# Patient Record
Sex: Male | Born: 1970
Health system: Southern US, Community
[De-identification: ages and names within clinical notes are randomized; demographics above are authoritative.]

## PROBLEM LIST (undated history)

## (undated) DIAGNOSIS — N529 Male erectile dysfunction, unspecified: Secondary | ICD-10-CM

## (undated) DIAGNOSIS — F419 Anxiety disorder, unspecified: Secondary | ICD-10-CM

## (undated) DIAGNOSIS — J302 Other seasonal allergic rhinitis: Secondary | ICD-10-CM

## (undated) DIAGNOSIS — R1310 Dysphagia, unspecified: Secondary | ICD-10-CM

## (undated) DIAGNOSIS — T7840XA Allergy, unspecified, initial encounter: Secondary | ICD-10-CM

## (undated) DIAGNOSIS — E785 Hyperlipidemia, unspecified: Secondary | ICD-10-CM

## (undated) DIAGNOSIS — K219 Gastro-esophageal reflux disease without esophagitis: Secondary | ICD-10-CM

## (undated) HISTORY — DX: Dysphagia, unspecified: R13.10

## (undated) HISTORY — DX: Gastro-esophageal reflux disease without esophagitis: K21.9

## (undated) HISTORY — DX: Male erectile dysfunction, unspecified: N52.9

## (undated) HISTORY — DX: Allergy, unspecified, initial encounter: T78.40XA

## (undated) HISTORY — DX: Other seasonal allergic rhinitis: J30.2

## (undated) HISTORY — DX: Hyperlipidemia, unspecified: E78.5

---

## 2009-12-11 ENCOUNTER — Telehealth: Payer: Self-pay | Admitting: Family Medicine

## 2009-12-11 ENCOUNTER — Ambulatory Visit: Payer: Self-pay | Admitting: Family Medicine

## 2009-12-11 DIAGNOSIS — K219 Gastro-esophageal reflux disease without esophagitis: Secondary | ICD-10-CM | POA: Insufficient documentation

## 2009-12-11 DIAGNOSIS — N529 Male erectile dysfunction, unspecified: Secondary | ICD-10-CM | POA: Insufficient documentation

## 2009-12-11 DIAGNOSIS — F528 Other sexual dysfunction not due to a substance or known physiological condition: Secondary | ICD-10-CM | POA: Insufficient documentation

## 2009-12-11 DIAGNOSIS — J45909 Unspecified asthma, uncomplicated: Secondary | ICD-10-CM | POA: Insufficient documentation

## 2009-12-12 LAB — CONVERTED CEMR LAB
AST: 33 units/L (ref 0–37)
Albumin: 4.4 g/dL (ref 3.5–5.2)
BUN: 11 mg/dL (ref 6–23)
Basophils Absolute: 0 10*3/uL (ref 0.0–0.1)
Basophils Relative: 0.6 % (ref 0.0–3.0)
Calcium: 9.3 mg/dL (ref 8.4–10.5)
Direct LDL: 153.7 mg/dL
Eosinophils Absolute: 0.2 10*3/uL (ref 0.0–0.7)
GFR calc non Af Amer: 80.26 mL/min (ref >60)
Glucose, Bld: 89 mg/dL (ref 70–99)
HDL: 42.3 mg/dL (ref >39.00)
Hemoglobin: 15.6 g/dL (ref 13.0–17.0)
Lymphocytes Relative: 32.6 % (ref 12.0–46.0)
MCHC: 32.7 g/dL (ref 30.0–36.0)
MCV: 95 fL (ref 78.0–100.0)
Monocytes Absolute: 0.6 10*3/uL (ref 0.1–1.0)
Neutro Abs: 2.5 10*3/uL (ref 1.4–7.7)
Neutrophils Relative %: 51.2 % (ref 43.0–77.0)
RDW: 12.5 % (ref 11.5–14.6)
Triglycerides: 106 mg/dL (ref 0.0–149.0)
WBC: 4.9 10*3/uL (ref 4.5–10.5)

## 2010-03-13 ENCOUNTER — Ambulatory Visit: Payer: Self-pay | Admitting: Family Medicine

## 2010-03-13 DIAGNOSIS — E785 Hyperlipidemia, unspecified: Secondary | ICD-10-CM | POA: Insufficient documentation

## 2010-03-16 LAB — CONVERTED CEMR LAB
ALT: 32 units/L (ref 0–53)
AST: 24 units/L (ref 0–37)
LDL Cholesterol: 129 mg/dL — ABNORMAL HIGH (ref 0–99)
VLDL: 17.8 mg/dL (ref 0.0–40.0)

## 2010-10-28 ENCOUNTER — Ambulatory Visit: Payer: Self-pay | Admitting: Internal Medicine

## 2010-10-28 DIAGNOSIS — J209 Acute bronchitis, unspecified: Secondary | ICD-10-CM | POA: Insufficient documentation

## 2011-01-07 NOTE — Assessment & Plan Note (Signed)
Summary: CPX   Vital Signs:  Patient profile:   40 year old male Height:      73.5 inches Weight:      233.75 pounds BMI:     30.53 Temp:     97.9 degrees F oral Pulse rate:   76 / minute Pulse rhythm:   regular BP sitting:   116 / 86  (left arm) Cuff size:   large  Vitals Entered By: Lewanda Rife LPN (March 13, 1609 10:38 AM) CC: complete physical   History of Present Illness: here for health mt and to disc chronic med problems   wt is down 2 lb   bp good 116/86  GERd - now on daily protonix  is much better - the symptoms are totally gone -- did not know he could feel this good also no problems with asthma   ED- cialis is working great   ? last TD-- has been a long time   no prostate problems that he knows of  nocturia once if any  no trouble with flow or stream    cholesterol high last check with HDL in 40s and LDL in 150s-- this is new  asked to watch his diet at that time  did cut out a lot of things high in sat fats - breakfast meats and fried foods  cut out the red meat- used to eat a lot of it  is wanting to loose some wt  no coronary artery disease infam  stil non smoker for 6 mo -- feels good   was exercising for a while - lifting weights and working in yard     Allergies (verified): No Known Drug Allergies  Past History:  Family History: Last updated: 03/13/2010 mother father grandfather lung ca and copd  grandmother died of ovarian cancer  no CAD or DM  ? if high chol in fam   Social History: Last updated: 12/11/2009 married  works in [employer] company quit smoking in 2010 (smoked for 15 years one ppd )  step daugher / one baby  exercise is intermittent  occ alcohol on weekends   Past Medical History: GERD asthma  ED allergic rhinitis  Family History: mother father grandfather lung ca and copd  grandmother died of ovarian cancer  no CAD or DM  ? if high chol in fam   Review of Systems General:  Denies fatigue, fever,  loss of appetite, and malaise. Eyes:  Denies blurring and eye irritation. ENT:  Complains of nasal congestion and postnasal drainage; denies sinus pressure. CV:  Denies chest pain or discomfort, palpitations, and shortness of breath with exertion. Resp:  Denies cough and wheezing. GI:  Denies abdominal pain, bloody stools, change in bowel habits, and nausea. GU:  Complains of erectile dysfunction; denies dysuria, nocturia, and urinary frequency. MS:  Denies joint pain, joint redness, and joint swelling. Derm:  Denies itching, lesion(s), poor wound healing, and rash. Neuro:  Denies numbness and tingling. Psych:  Denies anxiety and depression. Endo:  Denies cold intolerance, excessive thirst, excessive urination, and heat intolerance. Heme:  Denies abnormal bruising and bleeding.  Physical Exam  General:  Well-developed,well-nourished,in no acute distress; alert,appropriate and cooperative throughout examination Head:  normocephalic, atraumatic, and no abnormalities observed.   Eyes:  vision grossly intact, pupils equal, pupils round, and pupils reactive to light.  no conjunctival pallor, injection or icterus  Ears:  R ear normal and L ear normal.   Nose:  nares are boggy Mouth:  pharynx pink and moist.   Neck:  supple with full rom and no masses or thyromegally, no JVD or carotid bruit  Chest Wall:  No deformities, masses, tenderness or gynecomastia noted. Lungs:  Normal respiratory effort, chest expands symmetrically. Lungs are clear to auscultation, no crackles or wheezes. (no wheeze even on forced exp) Heart:  Normal rate and regular rhythm. S1 and S2 normal without gallop, murmur, click, rub or other extra sounds.  Abdomen:  Bowel sounds positive,abdomen soft and non-tender without masses, organomegaly or hernias noted. no renal bruits  Msk:  No deformity or scoliosis noted of thoracic or lumbar spine.  no acute joint changes  Pulses:  R and L carotid,radial,femoral,dorsalis pedis and  posterior tibial pulses are full and equal bilaterally Extremities:  No clubbing, cyanosis, edema, or deformity noted with normal full range of motion of all joints.   Neurologic:  sensation intact to light touch, gait normal, and DTRs symmetrical and normal.   Skin:  Intact without suspicious lesions or rashes some b9 app brown nevi and lentigos on back Cervical Nodes:  No lymphadenopathy noted Inguinal Nodes:  No significant adenopathy Psych:  normal affect, talkative and pleasant    Impression & Recommendations:  Problem # 1:  HEALTH MAINTENANCE EXAM (ICD-V70.0) Assessment Comment Only reviewed health habits including diet, exercise and skin cancer prevention reviewed health maintenance list and family history commended on better health habits  rev labs in detail - working on cholesterol td update today  Problem # 2:  ERECTILE DYSFUNCTION (ICD-302.72) Assessment: Unchanged stable - cialis works well refil for [employer]  His updated medication list for this problem includes:    Cialis 20 Mg Tabs (Tadalafil) .Marland Kitchen... Take by mouth as directed 30 min before sexual activity  Problem # 3:  GERD (ICD-530.81) Assessment: Improved much imp with daily ppi and diet - will continue  px printed for [facility]  f/u 1 year unless symptoms worsen His updated medication list for this problem includes:    Protonix 40 Mg Tbec (Pantoprazole sodium) .Marland Kitchen... 1 by mouth once daily  Problem # 4:  HYPERLIPIDEMIA (ICD-272.4) Assessment: New this is new- rev labs today in detail  goal LDL is under 130- rev reasons why  much better diet now -- much less sat fat  rev low sat fat diet/ things to avoid disc risks of high chol - vascular dz and MI and stroke over time- and reason to control it  lab today and adv  Orders: Venipuncture (62952) TLB-Lipid Panel (80061-LIPID) TLB-ALT (SGPT) (84460-ALT) TLB-AST (SGOT) (84450-SGOT)  Complete Medication List: 1)  Cialis 20 Mg Tabs (Tadalafil) .... Take by  mouth as directed 30 min before sexual activity 2)  Ventolin Hfa 108 (90 Base) Mcg/act Aers (Albuterol sulfate) .... 2 puffs up to every 4 hours as needed asthma 3)  Protonix 40 Mg Tbec (Pantoprazole sodium) .Marland Kitchen.. 1 by mouth once daily 4)  Claritin 10 Mg Tabs (Loratadine) .Marland Kitchen.. 1 by mouth once daily as needed  Other Orders: TD Toxoids IM 7 YR + (84132) Admin 1st Vaccine (44010)  Patient Instructions: 1)  tetnus shot today 2)  labs for cholesterol today 3)  keep up the good work with diet and get back to exercise 4)  no change in medicines  Prescriptions: CIALIS 20 MG TABS (TADALAFIL) take by mouth as directed 30 min before sexual activity  #27 x 3   Entered and Authorized by:   Judith Part MD   Signed by:   Colon Flattery  Danylah Holden MD on 03/13/2010   Method used:   Print then Give to Patient   RxID:   [id number] PROTONIX 40 MG TBEC (PANTOPRAZOLE SODIUM) 1 by mouth once daily  #90 x 3   Entered and Authorized by:   Judith Part MD   Signed by:   Judith Part MD on 03/13/2010   Method used:   Print then Give to Patient   RxID:   [id number]   Current Allergies (reviewed today): No known allergies     Immunizations Administered:  Tetanus Vaccine:    Vaccine Type: Td    Site: right deltoid    Mfr: Sanofi Pasteur    Dose: 0.5 ml    Route: IM    Given by: Lewanda Rife LPN    Exp. Date: 10/21/2011    Lot #: [id number]    VIS given: 10/24/07 version given March 13, 2010.

## 2011-01-07 NOTE — Assessment & Plan Note (Signed)
Summary: NEW PT TO EST/CLE   Vital Signs:  Patient profile:   40 year old male Height:      73.5 inches Weight:      235 pounds BMI:     30.69 Temp:     97.7 degrees F oral Pulse rate:   68 / minute Pulse rhythm:   regular BP sitting:   124 / 100  (left arm) Cuff size:   large  Vitals Entered By: Lowella Petties CMA (December 11, 2009 11:15 AM)  Serial Vital Signs/Assessments:  Time      Position  BP       Pulse  Resp  Temp     By                     135/85                         Judith Part MD  CC: New patient here to establish care.   History of Present Illness: here to est as new pt   used to see Dr Guerry Bruin - at [facility]  lives close by here in [place]- and this is more convenient   is fairly healthy  hx of acid reflux and prevacid works pretty well (does not take every day)  has bad heartburn every single day  occas has to take ranitidine on top of that -- if symptoms break through  protonix helped in past occ gets a sharp pain- brief in R upper abd  occ gets epigastric pain that is sharp as well   ? if he has ever had chol checked   never had endoscopy   asthma is fairly well controlled  quit smoking 3 months ago  need refil of his inhaler  feels a lot better now   ED - for whole adult life  has seen urologists times 2-- no particular reason  is generally innermittent - cialis works well  no hx of HTN   used to exercise more - new baby this year  plans to start exercise back next week    Allergies (verified): No Known Drug Allergies  Past History:  Past Medical History: GERD asthma  ED  Family History: mother father grandfather lung ca and copd  grandmother died of ovarian cancer  no CAD or DM   Social History: married  works in [employer] company quit smoking in 2010 (smoked for 15 years one ppd )  step daugher / one baby  exercise is intermittent  occ alcohol on weekends   Review of Systems General:  Denies  fatigue, fever, loss of appetite, malaise, and weight loss. ENT:  Denies sinus pressure and sore throat. CV:  Denies chest pain or discomfort, lightheadness, palpitations, and shortness of breath with exertion. Resp:  Complains of wheezing; denies cough, pleuritic, and shortness of breath. GI:  Complains of gas and indigestion; denies abdominal pain, change in bowel habits, loss of appetite, nausea, and vomiting. GU:  Complains of erectile dysfunction; denies discharge, dysuria, urinary frequency, and urinary hesitancy. MS:  Denies joint pain. Derm:  Denies lesion(s), poor wound healing, and rash. Neuro:  Denies numbness and tingling. Psych:  Denies anxiety and depression. Endo:  Denies cold intolerance, excessive thirst, excessive urination, and heat intolerance.  Physical Exam  General:  Well-developed,well-nourished,in no acute distress; alert,appropriate and cooperative throughout examination Head:  normocephalic, atraumatic, and no abnormalities observed.   Eyes:  vision grossly  intact, pupils equal, pupils round, and pupils reactive to light.  no conjunctival pallor, injection or icterus  Ears:  R ear normal and L ear normal.   Nose:  no nasal discharge.   Mouth:  pharynx pink and moist.   Neck:  supple with full rom and no masses or thyromegally, no JVD or carotid bruit  Chest Wall:  No deformities, masses, tenderness or gynecomastia noted. Lungs:  Normal respiratory effort, chest expands symmetrically. Lungs are clear to auscultation, no crackles or wheezes. (no wheeze even on forced exp) Heart:  Normal rate and regular rhythm. S1 and S2 normal without gallop, murmur, click, rub or other extra sounds.  Abdomen:  Bowel sounds positive,abdomen soft and non-tender without masses, organomegaly or hernias noted. no renal bruits Msk:  No deformity or scoliosis noted of thoracic or lumbar spine.  no acute joint changes  Pulses:  R and L carotid,radial,femoral,dorsalis pedis and posterior  tibial pulses are full and equal bilaterally Extremities:  No clubbing, cyanosis, edema, or deformity noted with normal full range of motion of all joints.   Neurologic:  sensation intact to light touch, gait normal, and DTRs symmetrical and normal.   Skin:  Intact without suspicious lesions or rashes Cervical Nodes:  No lymphadenopathy noted Inguinal Nodes:  No significant adenopathy Psych:  normal affect, talkative and pleasant    Impression & Recommendations:  Problem # 1:  GERD (ICD-530.81) Assessment New moderate with chronic daily heartburn disc risks of complications from this incl barretts- needs daily tx disc lifestyle change will call with pref list of PPIs so I can px  f/u 3 mo  handout given from [facility] His updated medication list for this problem includes:    Prevacid 24hr 15 Mg Cpdr (Lansoprazole) .Marland Kitchen... Take by mouth as needed  Problem # 2:  ASTHMA (ICD-493.90) Assessment: New mild- with occ rescue inhaler use commended on smoking cessation control of gerd should help this as well  His updated medication list for this problem includes:    Ventolin Hfa 108 (90 Base) Mcg/act Aers (Albuterol sulfate) .Marland Kitchen... 2 puffs up to every 4 hours as needed asthma  Problem # 3:  ERECTILE DYSFUNCTION (ICD-302.72) Assessment: New chronic and longstanding  refil cialis which works well for him  His updated medication list for this problem includes:    Cialis 20 Mg Tabs (Tadalafil) .Marland Kitchen... Take by mouth as directed 30 min before sexual activity  Complete Medication List: 1)  Cialis 20 Mg Tabs (Tadalafil) .... Take by mouth as directed 30 min before sexual activity 2)  Ventolin Hfa 108 (90 Base) Mcg/act Aers (Albuterol sulfate) .... 2 puffs up to every 4 hours as needed asthma 3)  Prevacid 24hr 15 Mg Cpdr (Lansoprazole) .... Take by mouth as needed  Other Orders: Venipuncture (16109) TLB-Lipid Panel (80061-LIPID) TLB-BMP (Basic Metabolic Panel-BMET) (80048-METABOL) TLB-Hepatic/Liver  Function Pnl (80076-HEPATIC) TLB-CBC Platelet - w/Differential (85025-CBCD) TLB-TSH (Thyroid Stimulating Hormone) (84443-TSH)  Patient Instructions: 1)  call your insurnace company--and see what PPI (proton pump inhibitor ) is covered - options include prevacid , prilosec, protonix-- etc 2)  leave me a message and I will px whatever if preferred  3)  avoid excessive alcohol, smoking, caffiene, spicy foods - for acid reflux 4)  update me if acid reflux symptoms worsen or do not improve  5)  schedule PE in about 3 months  6)  labs today Prescriptions: CIALIS 20 MG TABS (TADALAFIL) take by mouth as directed 30 min before sexual activity  #27 x  1   Entered and Authorized by:   Judith Part MD   Signed by:   Judith Part MD on 12/11/2009   Method used:   Print then Give to Patient   RxID:   [id number] VENTOLIN HFA 108 (90 BASE) MCG/ACT AERS (ALBUTEROL SULFATE) 2 puffs up to every 4 hours as needed asthma  #3 mdi x 1   Entered and Authorized by:   Judith Part MD   Signed by:   Judith Part MD on 12/11/2009   Method used:   Print then Give to Patient   RxID:   [id number]   Prior Medications: PREVACID 24HR 15 MG CPDR (LANSOPRAZOLE) take by mouth as needed Current Allergies (reviewed today): No known allergies

## 2011-01-07 NOTE — Letter (Signed)
Summary: Patient Questionnaire  Patient Questionnaire   Imported By: Beau Fanny 12/12/2009 08:49:37  _____________________________________________________________________  External Attachment:    Type:   Image     Comment:   External Document

## 2011-01-07 NOTE — Progress Notes (Signed)
Summary: Protonix  Phone Note Call from Patient Call back at Work Phone [phone]   Caller: Patient Call For: [facility] of Call: pt states he checked his insurance and Protonix is covered, pt would like generic Initial call taken by: Mervin Hack CMA Duncan Dull),  December 11, 2009 5:45 PM  Follow-up for Phone Call        please call in protonix 40 mg 1 by mouth once daily in am #30 11 ref -- thanks  Follow-up by: Judith Part MD,  December 12, 2009 7:48 AM  Additional Follow-up for Phone Call Additional follow up Details #1::        Pt is requesting a written 90 day script for mail order, he will pick up next week.  that is fine -- printed in put in nurse in box for pickup  Additional Follow-up by: Lowella Petties CMA,  December 12, 2009 12:57 PM    Additional Follow-up for Phone Call Additional follow up Details #2::    Script placed up front for pick up. Follow-up by: Lowella Petties CMA,  December 12, 2009 2:39 PM  New/Updated Medications: PROTONIX 40 MG TBEC (PANTOPRAZOLE SODIUM) 1 by mouth once daily Prescriptions: PROTONIX 40 MG TBEC (PANTOPRAZOLE SODIUM) 1 by mouth once daily  #90 x 3   Entered and Authorized by:   Judith Part MD   Signed by:   Judith Part MD on 12/12/2009   Method used:   Print then Give to Patient   RxID:   [id number]

## 2011-01-07 NOTE — Assessment & Plan Note (Signed)
Summary: COUGH X 3 WKS,WHEEZING,ASTHMA/CLE   Vital Signs:  Patient profile:   40 year old male Weight:      236.75 pounds O2 Sat:      96 % on Room air Temp:     98.3 degrees F oral Pulse rate:   76 / minute Pulse rhythm:   regular BP sitting:   124 / 82  (left arm) Cuff size:   large  Vitals Entered By: Selena Batten Dance CMA (AAMA) (October 28, 2010 9:22 AM)  O2 Flow:  Room air CC: Cough x1 month   History of Present Illness: CC: cold/cough  1 mo ago had cold, cough has lingered.  No other sxs regarding cold.  h/o asthma, rarely uses inhaler, but now having to use more frequently (daily) as well as waking up at night.  Dry cough.  Has tried singulair as well as robitussin.  + wheezing.  + SOB occasionally  No fevers/chills, congestion, RN, sneezing.  + initially wife and kids sick, now better.  no smokers at home.  Current Medications (verified): 1)  Cialis 20 Mg Tabs (Tadalafil) .... Take By Mouth As Directed 30 Min Before Sexual Activity 2)  Ventolin Hfa 108 (90 Base) Mcg/act Aers (Albuterol Sulfate) .... 2 Puffs Up To Every 4 Hours As Needed Asthma 3)  Protonix 40 Mg Tbec (Pantoprazole Sodium) .Marland Kitchen.. 1 By Mouth Once Daily 4)  Claritin 10 Mg Tabs (Loratadine) .Marland Kitchen.. 1 By Mouth Once Daily As Needed  Allergies (verified): No Known Drug Allergies  Past History:  Past Medical History: Last updated: 03/13/2010 GERD asthma  ED allergic rhinitis  Social History: Last updated: 12/11/2009 married  works in [employer] company quit smoking in 2010 (smoked for 15 years one ppd )  step daugher / one baby  exercise is intermittent  occ alcohol on weekends   Review of Systems       per HPI  Physical Exam  General:  Well-developed,well-nourished,in no acute distress; alert,appropriate and cooperative throughout examination Head:  normocephalic, atraumatic, and no abnormalities observed.  NT sinuses Eyes:  vision grossly intact, pupils equal, pupils round, and pupils reactive  to light.  no conjunctival pallor, injection or icterus  Ears:  R ear normal and L ear normal.   Nose:  nares are boggy Mouth:  pharynx pink and moist.   Neck:  supple with full rom and no masses or thyromegally, no JVD or carotid bruit  Lungs:  slight exp wheez, somewhat tight air movement, normal WOB Heart:  Normal rate and regular rhythm. S1 and S2 normal without gallop, murmur, click, rub or other extra sounds.  Pulses:  2+ rad pulses Extremities:  no pedal edema Skin:  Intact without suspicious lesions or rashes   Impression & Recommendations:  Problem # 1:  ACUTE BRONCHITIS (ICD-466.0) asthmatic bronchitis.  no need for abx as no fever, overall better expect for residual cough  Treat with prednisone x 10 days (precautions discussed), tessalon perls (has at home), and cheratussin for night time. red flags to return discussed.  continue albuterol.  His updated medication list for this problem includes:    Ventolin Hfa 108 (90 Base) Mcg/act Aers (Albuterol sulfate) .Marland Kitchen... 2 puffs up to every 4 hours as needed asthma    Cheratussin Ac 100-10 Mg/61ml Syrp (Guaifenesin-codeine) ..... One teaspoon q6 hours, mainly at bedtime as needed cough  Complete Medication List: 1)  Cialis 20 Mg Tabs (Tadalafil) .... Take by mouth as directed 30 min before sexual activity 2)  Ventolin  Hfa 108 (90 Base) Mcg/act Aers (Albuterol sulfate) .... 2 puffs up to every 4 hours as needed asthma 3)  Protonix 40 Mg Tbec (Pantoprazole sodium) .Marland Kitchen.. 1 by mouth once daily 4)  Claritin 10 Mg Tabs (Loratadine) .Marland Kitchen.. 1 by mouth once daily as needed 5)  Prednisone 20 Mg Tabs (Prednisone) .... 2 pills daily for 10 days 6)  Cheratussin Ac 100-10 Mg/57ml Syrp (Guaifenesin-codeine) .... One teaspoon q6 hours, mainly at bedtime as needed cough  Patient Instructions: 1)  sounds like bronchitis causing asthma to act up. 2)  Take steroids for 10 days, one daily. 3)  Cheratussin for night time. 4)  Tessalon perls during day. 5)   Good to meet you today. 6)  Push fliuds and rest. 7)  If you start having fevers, worsening breathing or cough, you may need to be seen again. Prescriptions: CHERATUSSIN AC 100-10 MG/5ML SYRP (GUAIFENESIN-CODEINE) one teaspoon q6 hours, mainly at bedtime as needed cough  #100cc x 0   Entered and Authorized by:   Eustaquio Boyden  MD   Signed by:   Eustaquio Boyden  MD on 10/28/2010   Method used:   Print then Give to Patient   RxID:   [id number] PREDNISONE 20 MG TABS (PREDNISONE) 2 pills daily for 10 days  #20 x 0   Entered and Authorized by:   Eustaquio Boyden  MD   Signed by:   Eustaquio Boyden  MD on 10/28/2010   Method used:   Electronically to        [facility] [address] (retail)       [address], [address]       [place], [place]  [address]       Ph: [phone]       Fax: [phone]   RxID:   [id number]    Orders Added: 1)  Est. Patient Level III [28315]    Current Allergies (reviewed today): No known allergies

## 2011-02-10 ENCOUNTER — Encounter: Payer: Self-pay | Admitting: Family Medicine

## 2011-02-24 ENCOUNTER — Ambulatory Visit (INDEPENDENT_AMBULATORY_CARE_PROVIDER_SITE_OTHER): Payer: BC Managed Care – PPO | Admitting: Family Medicine

## 2011-02-24 ENCOUNTER — Encounter: Payer: Self-pay | Admitting: Family Medicine

## 2011-02-24 VITALS — BP 110/76 | HR 80 | Temp 98.3°F | Wt 230.0 lb

## 2011-02-24 DIAGNOSIS — K219 Gastro-esophageal reflux disease without esophagitis: Secondary | ICD-10-CM

## 2011-02-24 DIAGNOSIS — J309 Allergic rhinitis, unspecified: Secondary | ICD-10-CM

## 2011-02-24 DIAGNOSIS — J302 Other seasonal allergic rhinitis: Secondary | ICD-10-CM | POA: Insufficient documentation

## 2011-02-24 DIAGNOSIS — J45909 Unspecified asthma, uncomplicated: Secondary | ICD-10-CM

## 2011-02-24 MED ORDER — ALBUTEROL SULFATE HFA 108 (90 BASE) MCG/ACT IN AERS: 2.0000 | INHALATION_SPRAY | RESPIRATORY_TRACT | Status: DC | PRN

## 2011-02-24 MED ORDER — FLUTICASONE PROPIONATE (INHAL) 100 MCG/BLIST IN AEPB: 1.0000 | INHALATION_SPRAY | Freq: Two times a day (BID) | RESPIRATORY_TRACT | Status: DC

## 2011-02-24 MED ORDER — MONTELUKAST SODIUM 10 MG PO TABS: 10.0000 mg | ORAL_TABLET | Freq: Every day | ORAL | Status: DC

## 2011-02-24 NOTE — Assessment & Plan Note (Signed)
Recommend continue PPI daily.

## 2011-02-24 NOTE — Assessment & Plan Note (Signed)
Continue claritin PRN.  Start singulair. If not better, consider INS.

## 2011-02-24 NOTE — Progress Notes (Signed)
CC: asthma  HPI:  Sick October with asthmatic bronchitis, placed on prednisone as well as cheratussin.  cleared things up pretty well.  But since then, has noticed asthma more out of control.  Allergies seem to trigger asthma.  Using albuterol as well as singulair he had leftover at home.  Since singulair started, needing alb less.  Never been on ICS.  Waking up nightly 2/2 wheeze.  No problems at work.  Not really coughing, more wheeze/sob.  +  Nasal congestion as well as itchy watery eyes, sneezing, RN, roof of mouth itching from allergies.  Takes claritin as needed, or benadryl.  Singulair left over controlling sxs.  Notices having lots of trouble playing basketball, gets too SOB with this.  Before Oct 2011, no issues with asthma, wouldn't even need inhaler.  No recent fevers/chills, n/v/d, abd pain, HA, ST.    PMHx reviewed.  Past Medical History  Diagnosis Date  . GERD (gastroesophageal reflux disease)   . Asthma   . ED (erectile dysfunction)   . Seasonal allergies      ROS: per HPI  PE:  Vitals reviewed and stable.  PEF reviewed. Gen: pleasant, WDWN male, NAD, breathing easily HEENT: NCAT Ears: TMs clear bilaterally, external canals clear Eyes: PERRLA, EOMI Throat: MMM, no pharyngeal erythema/exudates, no cobblestoning, no PNDrip Neck: supple, FROM, no LAD, no TM Pulm: CTAB, no crackles or wheezing, minimally tight air movement.

## 2011-02-24 NOTE — Patient Instructions (Signed)
Start singulair daily for asthma and allergies.  If not better with this, start inhaled steroid. Sample of inhaled steroid (daily controller medicine) for asthma provided.  May start and notify us if good improvement in 2 wks with this to add to your medicine list.  Rinse mouth after use.   Use albuterol 2 puffs 15 minutes prior to exercise to help increase exercise capacity. Follow up with Dr. Milinda Antis in 2 months (previously scheduled).  Return sooner if worsening or concerns. Good to see you today.  Call us with any questions.

## 2011-02-24 NOTE — Assessment & Plan Note (Addendum)
Anticipate deterioration of asthma 2/2 allergies with some EIB component.  Expected peak flow for height/age would be ~ 620 L/min.  Today's was 570. Recommend albuterol 2 puffs 15 min prior to exercise. Recommend start singulair for both allergies and asthma control. If not controlled, start ICS (flovent 1 puff BID).  Sample provided to last 2 wks, instructed to call us with update and would send into pharmacy. Already has appt with pcp in ~1 mo.  F/u then.

## 2011-03-23 ENCOUNTER — Ambulatory Visit: Payer: Self-pay | Admitting: Family Medicine

## 2011-03-25 ENCOUNTER — Telehealth: Payer: Self-pay | Admitting: Family Medicine

## 2011-03-25 DIAGNOSIS — E785 Hyperlipidemia, unspecified: Secondary | ICD-10-CM

## 2011-03-25 DIAGNOSIS — Z0000 Encounter for general adult medical examination without abnormal findings: Secondary | ICD-10-CM

## 2011-03-25 NOTE — Telephone Encounter (Signed)
Message copied by Roxy Manns on Thu Mar 25, 2011  5:12 PM ------      Message from: Liane Comber      Created: Wed Mar 24, 2011 11:26 AM      Regarding: Cpx labs fri 4/27       Please order  future cpx labs for pt's upcomming lab appt.      Thanks      Rodney Booze

## 2011-04-02 ENCOUNTER — Other Ambulatory Visit (INDEPENDENT_AMBULATORY_CARE_PROVIDER_SITE_OTHER): Payer: BC Managed Care – PPO | Admitting: Family Medicine

## 2011-04-02 DIAGNOSIS — E785 Hyperlipidemia, unspecified: Secondary | ICD-10-CM

## 2011-04-02 DIAGNOSIS — Z0000 Encounter for general adult medical examination without abnormal findings: Secondary | ICD-10-CM

## 2011-04-02 LAB — CBC WITH DIFFERENTIAL/PLATELET
Basophils Absolute: 0 10*3/uL (ref 0.0–0.1)
Eosinophils Absolute: 0.7 10*3/uL (ref 0.0–0.7)
HCT: 45.5 % (ref 39.0–52.0)
Hemoglobin: 15.8 g/dL (ref 13.0–17.0)
Lymphs Abs: 1.7 10*3/uL (ref 0.7–4.0)
MCHC: 34.7 g/dL (ref 30.0–36.0)
MCV: 93.9 fl (ref 78.0–100.0)
Neutro Abs: 2.6 10*3/uL (ref 1.4–7.7)
RDW: 13.1 % (ref 11.5–14.6)

## 2011-04-02 LAB — LIPID PANEL
Cholesterol: 191 mg/dL (ref 0–200)
HDL: 41.7 mg/dL (ref >39.00)
Total CHOL/HDL Ratio: 5
Triglycerides: 89 mg/dL (ref 0.0–149.0)

## 2011-04-02 LAB — COMPREHENSIVE METABOLIC PANEL
ALT: 29 U/L (ref 0–53)
AST: 23 U/L (ref 0–37)
Creatinine, Ser: 1.1 mg/dL (ref 0.4–1.5)
GFR: 78.05 mL/min (ref >60.00)
Total Bilirubin: 0.7 mg/dL (ref 0.3–1.2)

## 2011-04-06 ENCOUNTER — Encounter: Payer: Self-pay | Admitting: Family Medicine

## 2011-04-06 ENCOUNTER — Ambulatory Visit (INDEPENDENT_AMBULATORY_CARE_PROVIDER_SITE_OTHER): Payer: BC Managed Care – PPO | Admitting: Family Medicine

## 2011-04-06 DIAGNOSIS — E785 Hyperlipidemia, unspecified: Secondary | ICD-10-CM

## 2011-04-06 DIAGNOSIS — K219 Gastro-esophageal reflux disease without esophagitis: Secondary | ICD-10-CM

## 2011-04-06 DIAGNOSIS — Z0000 Encounter for general adult medical examination without abnormal findings: Secondary | ICD-10-CM

## 2011-04-06 DIAGNOSIS — J302 Other seasonal allergic rhinitis: Secondary | ICD-10-CM

## 2011-04-06 DIAGNOSIS — J309 Allergic rhinitis, unspecified: Secondary | ICD-10-CM

## 2011-04-06 DIAGNOSIS — J45909 Unspecified asthma, uncomplicated: Secondary | ICD-10-CM

## 2011-04-06 MED ORDER — ALBUTEROL SULFATE HFA 108 (90 BASE) MCG/ACT IN AERS: 2.0000 | INHALATION_SPRAY | RESPIRATORY_TRACT | Status: DC | PRN

## 2011-04-06 MED ORDER — FLUTICASONE PROPIONATE 50 MCG/ACT NA SUSP: 2.0000 | Freq: Every day | NASAL | Status: DC

## 2011-04-06 MED ORDER — PANTOPRAZOLE SODIUM 40 MG PO TBEC: 40.0000 mg | DELAYED_RELEASE_TABLET | Freq: Every day | ORAL | Status: DC

## 2011-04-06 MED ORDER — MONTELUKAST SODIUM 10 MG PO TABS: 10.0000 mg | ORAL_TABLET | Freq: Every day | ORAL | Status: DC

## 2011-04-06 MED ORDER — TADALAFIL 20 MG PO TABS: 20.0000 mg | ORAL_TABLET | Freq: Every day | ORAL | Status: DC | PRN

## 2011-04-06 NOTE — Patient Instructions (Signed)
Get your house checked out for mold - consider it  Try allegra 180 mg once daily over the counter instead of the claritin for a while to see if it works better  Campbell Soup every day as directed  No other medicine change  Avoid red meat/ fried foods/ egg yolks/ fatty breakfast meats/ butter, cheese and high fat dairy/ and shellfish   Keep up the good health habits  If asthma does not continue to improve - we will add an inhaled steroid - so call and let me know

## 2011-04-06 NOTE — Assessment & Plan Note (Signed)
Reviewed health habits including diet and exercise and skin cancer prevention Also reviewed health mt list, fam hx and immunizations   Overall good health habits Rev wellness labs in detail  Rev low sat fat diet

## 2011-04-06 NOTE — Progress Notes (Signed)
Subjective:    Patient ID: Mario Lawrence, male    DOB: 1970-12-30, 40 y.o.   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 If ever got flonase  Zyrtec really helps but makes him very sleepy -- will take occas  Has never tried allegra     Lipids are overall stable with LDL in low 130s Diet is overall good - cut out all fried foods and eating white meat and balanced meals  Not able to exercise as much in past 2 mo due to time Loves to play basketball - 2 times per week at church   Lab Results  Component Value Date   CHOL 191 04/02/2011   CHOL 187 03/13/2010   CHOL 216* 12/11/2009   Lab Results  Component Value Date   HDL 41.70 04/02/2011   HDL 62.13 03/13/2010   HDL 42.30 12/11/2009   Lab Results  Component Value Date   LDLCALC 132* 04/02/2011   LDLCALC 129* 03/13/2010   Lab Results  Component Value Date   TRIG 89.0 04/02/2011   TRIG 89.0 03/13/2010   TRIG 106.0 12/11/2009   Lab Results  Component Value Date   CHOLHDL 5 04/02/2011   CHOLHDL 5 03/13/2010   CHOLHDL 5 12/11/2009     Other labs in wellness prof nl  Wt is stable with bmi of 30  bp good at 126/84  Td 4/11 is up to date   GERD-- on protonix which works very well  Watches his diet   No trouble urinating and no nocturia     Review of Systems Review of Systems  Constitutional: Negative for fever, appetite change, fatigue and unexpected  weight change.  Eyes: Negative for pain and visual disturbance.  Respiratory: Negative for cough and shortness of breath.  ,pos for wheezing  ENTpos for rhinorrhea and sneezing and congestion without sinus pain Cardiovascular: Negative for cp or sob or edema  Gastrointestinal: Negative for nausea, diarrhea and constipation.  Genitourinary: Negative for urgency and frequency.  Skin: Negative for pallor.  Neurological: Negative for weakness, light-headedness, numbness and headaches.  Hematological: Negative for adenopathy. Does not bruise/bleed easily.  Psychiatric/Behavioral: Negative for dysphoric mood. The patient is not nervous/anxious.          Objective:   Physical Exam  Constitutional: He appears well-developed and well-nourished. No distress.  HENT:  Head: Normocephalic and atraumatic.  Right Ear: External ear normal.  Left Ear: External ear normal.  Mouth/Throat: Oropharynx is clear and moist.       Some post nasal drip Rhinorrhea / pale nasal mucosa noted No sinus tenderness  Eyes: Conjunctivae and EOM are normal. Right eye exhibits no discharge. Left eye exhibits no discharge.  Neck: Neck supple. No JVD present. Carotid bruit is not present. No thyromegaly present.  Cardiovascular: Normal rate,  regular rhythm and normal heart sounds.   No murmur heard. Pulmonary/Chest: No respiratory distress. He has wheezes. He has no rales.       Wheeze on forced exp only Overall good air exch   Abdominal: Soft. Bowel sounds are normal. He exhibits no abdominal bruit and no mass. There is no tenderness.  Musculoskeletal: Normal range of motion. He exhibits no edema and no tenderness.  Lymphadenopathy:    He has no cervical adenopathy.  Neurological: He is alert. He has normal reflexes. Coordination normal.  Skin: Skin is warm and dry. No rash noted. No erythema. No pallor.  Psychiatric: He has a normal mood and affect.          Assessment & Plan:

## 2011-04-06 NOTE — Assessment & Plan Note (Signed)
Continue protonix  Working well

## 2011-04-06 NOTE — Assessment & Plan Note (Signed)
This is worse- oddly inside Will get house checked for mold and consider all eval if not imp Change to allegra Add flonase  Then update

## 2011-04-06 NOTE — Assessment & Plan Note (Signed)
Improved with addn of singulaire Has not needed inhaled corticosteroid Will continue to follow closely  Allergens and exercise continue to trigger him

## 2011-05-01 ENCOUNTER — Other Ambulatory Visit: Payer: Self-pay | Admitting: Family Medicine

## 2011-05-13 ENCOUNTER — Telehealth: Payer: Self-pay | Admitting: *Deleted

## 2011-05-13 NOTE — Telephone Encounter (Signed)
What is that? - I have never heard of it ?

## 2011-05-13 NOTE — Telephone Encounter (Signed)
Patient notified as instructed by telephone. 

## 2011-05-13 NOTE — Telephone Encounter (Signed)
Pt's right ear is clogged and he is requesting" Lodrain" be called to [facility] [address]. He isnt having any other symptoms.  I told his wife that you are out of the office today and may not see this note until tomorrow.

## 2011-05-13 NOTE — Telephone Encounter (Signed)
Please let him know that- thanks

## 2011-05-13 NOTE — Telephone Encounter (Signed)
I called [facility] [address] and Meisha said Lodrain is no longer available but it was an allergy med like Claritin or Zyrtec.

## 2011-11-18 ENCOUNTER — Other Ambulatory Visit: Payer: Self-pay | Admitting: *Deleted

## 2011-11-18 MED ORDER — FLUTICASONE PROPIONATE 50 MCG/ACT NA SUSP: 2.0000 | Freq: Every day | NASAL | Status: DC

## 2011-11-18 NOTE — Telephone Encounter (Signed)
rx sent to pharmacy by e-script  

## 2012-01-26 ENCOUNTER — Telehealth: Payer: Self-pay | Admitting: Family Medicine

## 2012-01-26 NOTE — Telephone Encounter (Signed)
That is fine - but please try not to overload me -- I have had a bunch of extra physicals for the past 3 days , thanks

## 2012-01-26 NOTE — Telephone Encounter (Signed)
Pt is calling stating that he has a CPE coming up in April but his insurance is requiring that he gets in done by the 02/03/12. Is this ok to put him in somewhere next week ???

## 2012-01-31 ENCOUNTER — Telehealth: Payer: Self-pay | Admitting: Family Medicine

## 2012-01-31 DIAGNOSIS — E785 Hyperlipidemia, unspecified: Secondary | ICD-10-CM

## 2012-01-31 DIAGNOSIS — Z0000 Encounter for general adult medical examination without abnormal findings: Secondary | ICD-10-CM

## 2012-01-31 NOTE — Telephone Encounter (Signed)
Message copied by Judy Pimple on Mon Jan 31, 2012  9:36 PM ------      Message from: Alvina Chou      Created: Mon Jan 31, 2012  2:45 PM      Regarding: labs for 2.26.2013       Patient is scheduled for CPX labs, please order future labs, Thanks , Camelia Eng

## 2012-02-01 ENCOUNTER — Other Ambulatory Visit (INDEPENDENT_AMBULATORY_CARE_PROVIDER_SITE_OTHER): Payer: BC Managed Care – PPO

## 2012-02-01 DIAGNOSIS — E785 Hyperlipidemia, unspecified: Secondary | ICD-10-CM

## 2012-02-01 DIAGNOSIS — Z0000 Encounter for general adult medical examination without abnormal findings: Secondary | ICD-10-CM

## 2012-02-01 LAB — CBC WITH DIFFERENTIAL/PLATELET
Basophils Relative: 0.5 % (ref 0.0–3.0)
Eosinophils Absolute: 0.4 10*3/uL (ref 0.0–0.7)
Eosinophils Relative: 6.7 % — ABNORMAL HIGH (ref 0.0–5.0)
HCT: 46 % (ref 39.0–52.0)
Lymphs Abs: 1.7 10*3/uL (ref 0.7–4.0)
MCHC: 33.9 g/dL (ref 30.0–36.0)
MCV: 92.8 fl (ref 78.0–100.0)
Monocytes Absolute: 0.7 10*3/uL (ref 0.1–1.0)
Neutro Abs: 3.6 10*3/uL (ref 1.4–7.7)
RBC: 4.95 Mil/uL (ref 4.22–5.81)
WBC: 6.5 10*3/uL (ref 4.5–10.5)

## 2012-02-01 LAB — COMPREHENSIVE METABOLIC PANEL
AST: 20 U/L (ref 0–37)
Albumin: 4.4 g/dL (ref 3.5–5.2)
Alkaline Phosphatase: 73 U/L (ref 39–117)
BUN: 18 mg/dL (ref 6–23)
Glucose, Bld: 91 mg/dL (ref 70–99)
Potassium: 5 mEq/L (ref 3.5–5.1)
Sodium: 140 mEq/L (ref 135–145)
Total Bilirubin: 0.8 mg/dL (ref 0.3–1.2)

## 2012-02-01 LAB — LIPID PANEL
LDL Cholesterol: 122 mg/dL — ABNORMAL HIGH (ref 0–99)
Total CHOL/HDL Ratio: 4

## 2012-02-02 ENCOUNTER — Ambulatory Visit (INDEPENDENT_AMBULATORY_CARE_PROVIDER_SITE_OTHER): Payer: BC Managed Care – PPO | Admitting: Family Medicine

## 2012-02-02 ENCOUNTER — Encounter: Payer: Self-pay | Admitting: Family Medicine

## 2012-02-02 VITALS — BP 124/82 | HR 80 | Temp 98.0°F | Ht 73.25 in | Wt 217.8 lb

## 2012-02-02 DIAGNOSIS — K219 Gastro-esophageal reflux disease without esophagitis: Secondary | ICD-10-CM

## 2012-02-02 DIAGNOSIS — Z0000 Encounter for general adult medical examination without abnormal findings: Secondary | ICD-10-CM

## 2012-02-02 DIAGNOSIS — J45909 Unspecified asthma, uncomplicated: Secondary | ICD-10-CM

## 2012-02-02 DIAGNOSIS — E785 Hyperlipidemia, unspecified: Secondary | ICD-10-CM

## 2012-02-02 MED ORDER — FLUTICASONE PROPIONATE 50 MCG/ACT NA SUSP: 2.0000 | Freq: Every day | NASAL | Status: DC

## 2012-02-02 MED ORDER — TADALAFIL 20 MG PO TABS: 20.0000 mg | ORAL_TABLET | Freq: Every day | ORAL | Status: DC | PRN

## 2012-02-02 MED ORDER — ALBUTEROL SULFATE HFA 108 (90 BASE) MCG/ACT IN AERS: 2.0000 | INHALATION_SPRAY | RESPIRATORY_TRACT | Status: DC | PRN

## 2012-02-02 MED ORDER — PANTOPRAZOLE SODIUM 40 MG PO TBEC: 40.0000 mg | DELAYED_RELEASE_TABLET | Freq: Every day | ORAL | Status: DC

## 2012-02-02 NOTE — Progress Notes (Signed)
Subjective:    Patient ID: Mario Lawrence, male    DOB: 1971-07-20, 41 y.o.   MRN: [id number]  HPI Here for health maintenance exam and to review chronic medical problems   Is doing well overall  Is doing a lot better overall - had a lot of trouble with allergies  His house he found out was full of mold  Had it cleared out - and feels much better !  Has a new life  Does not need inhaler/ asthma med or singulair anymore at all    bp is 124/82- good Lost 14 lb from last visit - bmi 28, is much better Started loosing wt in fall when he was cutting down trees  Eats fairly healthy- has cut out a lot of high fat foods and red meat and fried foods   Diet-- is much better Exercise- no time to go these days -- with labs  yardwork is coming   GERD- protonix- has not noticed a lot of change  That has controlled it well   Father was just dx with colon cancer - surgery is upcoming - was recent   Lipids are improved Lab Results  Component Value Date   CHOL 189 02/01/2012   CHOL 191 04/02/2011   CHOL 187 03/13/2010   Lab Results  Component Value Date   HDL 46.30 02/01/2012   HDL 40.98 04/02/2011   HDL 11.91 03/13/2010   Lab Results  Component Value Date   LDLCALC 122* 02/01/2012   LDLCALC 132* 04/02/2011   LDLCALC 129* 03/13/2010   Lab Results  Component Value Date   TRIG 105.0 02/01/2012   TRIG 89.0 04/02/2011   TRIG 89.0 03/13/2010   Lab Results  Component Value Date   CHOLHDL 4 02/01/2012   CHOLHDL 5 04/02/2011   CHOLHDL 5 03/13/2010   Lab Results  Component Value Date   LDLDIRECT 153.7 12/11/2009   is doing well with that  Wants to start exercise May start fish oil  Flu shot   Takes cialis for ED Has not taken that for a while - infrequent but needs a px   No prostate problems or nocturia   Patient Active Problem List  Diagnoses  . HYPERLIPIDEMIA  . ERECTILE DYSFUNCTION  . ASTHMA  . GERD  . Seasonal allergies  . Routine general medical examination at a health care  facility   Past Medical History  Diagnosis Date  . GERD (gastroesophageal reflux disease)   . Asthma   . ED (erectile dysfunction)   . Seasonal allergies    No past surgical history on file. History  Substance Use Topics  . Smoking status: Former Smoker    Quit date: 12/06/2008  . Smokeless tobacco: Not on file  . Alcohol Use: Yes     Occasional-weekends   Family History  Problem Relation Age of Onset  . Cancer Maternal Grandmother     ovarian  . Cancer Maternal Grandfather     lung cancer  . COPD Maternal Grandfather   . Colon cancer Father   . Ovarian cancer Paternal Grandmother    Allergies  Allergen Reactions  . Codeine Nausea Only   No current outpatient prescriptions on file prior to visit.         Review of Systems Review of Systems  Constitutional: Negative for fever, appetite change, fatigue and unexpected weight change.  Eyes: Negative for pain and visual disturbance.  Respiratory: Negative for cough and shortness of breath.  no wheeze at all  lately Cardiovascular: Negative for cp or palpitations    Gastrointestinal: Negative for nausea, diarrhea and constipation.  Genitourinary: Negative for urgency and frequency.  Skin: Negative for pallor or rash   Neurological: Negative for weakness, light-headedness, numbness and headaches.  Hematological: Negative for adenopathy. Does not bruise/bleed easily.  Psychiatric/Behavioral: Negative for dysphoric mood. The patient is not nervous/anxious.          Objective:   Physical Exam  Constitutional: He appears well-developed and well-nourished.  HENT:  Head: Normocephalic and atraumatic.  Right Ear: External ear normal.  Left Ear: External ear normal.  Nose: Nose normal.  Mouth/Throat: Oropharynx is clear and moist.  Eyes: Conjunctivae and EOM are normal. Pupils are equal, round, and reactive to light. No scleral icterus.  Neck: Normal range of motion. Neck supple. No JVD present. Carotid bruit is not  present. No thyromegaly present.  Cardiovascular: Normal rate, regular rhythm, normal heart sounds and intact distal pulses.  Exam reveals no gallop.   No murmur heard. Pulmonary/Chest: Effort normal and breath sounds normal. No respiratory distress. He has no wheezes. He has no rales.       Good air exch  Abdominal: Soft. Bowel sounds are normal. He exhibits no distension, no abdominal bruit and no mass. There is no tenderness.  Musculoskeletal: Normal range of motion. He exhibits no edema and no tenderness.  Lymphadenopathy:    He has no cervical adenopathy.  Neurological: He is alert. He has normal reflexes. No cranial nerve deficit. He exhibits normal muscle tone. Coordination normal.  Skin: Skin is warm and dry. No rash noted. No erythema. No pallor.       Diffuse lentigos on back and ext with some brown nl appearing nevi  Psychiatric: He has a normal mood and affect.          Assessment & Plan:

## 2012-02-02 NOTE — Assessment & Plan Note (Signed)
This continues to improve with good habits and wt loss Disc goals for lipids and reasons to control them Rev labs with pt Rev low sat fat diet in detail

## 2012-02-02 NOTE — Assessment & Plan Note (Addendum)
  Reviewed health habits including diet and exercise and skin cancer prevention Also reviewed health mt list, fam hx and immunizations   Reviewed wellness labs in detail Form filled out for employer with vitals/ labs and waist measurment (36 in) Will need colonosc at 42 in light of family hx  Adv to start getting flu shots yearly

## 2012-02-02 NOTE — Patient Instructions (Signed)
Good work with weight loss - try to keep it off If you want to try stopping the protonix for a while - see how it goes - but if symptoms come back for more than a week- start it again  You will need a colonoscopy due to family history at 54  Avoid red meat/ fried foods/ egg yolks/ fatty breakfast meats/ butter, cheese and high fat dairy/ and shellfish   In future - get a yearly flu shot

## 2012-02-02 NOTE — Assessment & Plan Note (Signed)
Pt is symptom free with protonix - he may not need it in light of wt loss He will try several weeks without it - and restart if symptoms re occur

## 2012-02-23 ENCOUNTER — Other Ambulatory Visit: Payer: Self-pay | Admitting: *Deleted

## 2012-02-23 MED ORDER — PANTOPRAZOLE SODIUM 40 MG PO TBEC: 40.0000 mg | DELAYED_RELEASE_TABLET | Freq: Every day | ORAL | Status: DC

## 2012-02-23 NOTE — Telephone Encounter (Signed)
Patients spouse called to request Rxs for Protonix be sent to [facility] and [facility].

## 2012-03-14 ENCOUNTER — Other Ambulatory Visit: Payer: BC Managed Care – PPO

## 2012-03-21 ENCOUNTER — Encounter: Payer: BC Managed Care – PPO | Admitting: Family Medicine

## 2012-04-20 ENCOUNTER — Telehealth: Payer: Self-pay

## 2012-04-20 NOTE — Telephone Encounter (Signed)
pts wife request appt for pt due to acid reflux and heartburn; no chest pain, no difficulty breathing, no SOB, no abdominal pain, no fever. Pt s wife request appt for 04/28/12. appt given 04/28/12 at 8 am. Pt to be here at 7:45 to ck in. Pt will call sooner if condition changes or worsens.

## 2012-04-28 ENCOUNTER — Encounter: Payer: Self-pay | Admitting: Family Medicine

## 2012-04-28 ENCOUNTER — Ambulatory Visit (INDEPENDENT_AMBULATORY_CARE_PROVIDER_SITE_OTHER): Payer: BC Managed Care – PPO | Admitting: Family Medicine

## 2012-04-28 ENCOUNTER — Encounter: Payer: Self-pay | Admitting: Internal Medicine

## 2012-04-28 VITALS — BP 120/80 | HR 76 | Temp 98.7°F | Ht 73.25 in | Wt 221.2 lb

## 2012-04-28 DIAGNOSIS — K219 Gastro-esophageal reflux disease without esophagitis: Secondary | ICD-10-CM

## 2012-04-28 DIAGNOSIS — R1310 Dysphagia, unspecified: Secondary | ICD-10-CM | POA: Insufficient documentation

## 2012-04-28 MED ORDER — PANTOPRAZOLE SODIUM 40 MG PO TBEC: 40.0000 mg | DELAYED_RELEASE_TABLET | Freq: Two times a day (BID) | ORAL | Status: DC

## 2012-04-28 NOTE — Patient Instructions (Signed)
Increase your protonix to twice daily am and pm  Try to reduce caffeine and watch diet  We will refer you to GI at check out

## 2012-04-28 NOTE — Progress Notes (Signed)
Subjective:    Patient ID: Mario Lawrence, male    DOB: [birth year], 41 y.o.   MRN: [id number]  HPI Here for f/u of GERD Wt is up 4 lb bmi 28  Ever since early march- is having GERd that makes him feel like he is choking  Feels like lump in his throat / low  Has come and gone in the past - worse with stress and anxiety (relaxation tech have helped) Cannot get under control right now  Also fairly continuous heartburn , burning sensation also in epigastrium  Is having breakthrough symptoms even on protonix   Stressors - a lot of work and long hours/ staying up too late/ not enough sleep  Has social anxiety issues too in public- his whole life   Took 2 protonix and 2 ranitidine in one day- that got rid of it    Was prev symptom free on protonix 42 Did go off of it for 2 weeks in the past-- and it got worse    Patient Active Problem List  Diagnoses  . HYPERLIPIDEMIA  . ERECTILE DYSFUNCTION  . ASTHMA  . GERD  . Seasonal allergies  . Routine general medical examination at a health care facility  . Dysphagia   Past Medical History  Diagnosis Date  . GERD (gastroesophageal reflux disease)   . Asthma   . ED (erectile dysfunction)   . Seasonal allergies    No past surgical history on file. History  Substance Use Topics  . Smoking status: Former Smoker    Quit date: 12/06/2008  . Smokeless tobacco: Not on file  . Alcohol Use: Yes     Occasional-weekends   Family History  Problem Relation Age of Onset  . Cancer Maternal Grandmother     ovarian  . Cancer Maternal Grandfather     lung cancer  . COPD Maternal Grandfather   . Colon cancer Father   . Ovarian cancer Paternal Grandmother    Allergies  Allergen Reactions  . Codeine Nausea Only   Current Outpatient Prescriptions on File Prior to Visit  Medication Sig Dispense Refill  . albuterol (VENTOLIN HFA) 108 (90 BASE) MCG/ACT inhaler Inhale 2 puffs into the lungs every 4 (four) hours as needed.  1 Inhaler  5    . fluticasone (FLONASE) 50 MCG/ACT nasal spray Place 2 sprays into the nose daily.  50 g  3  . tadalafil (CIALIS) 20 MG tablet Take 1 tablet (20 mg total) by mouth daily as needed for erectile dysfunction. Take by mouth as directed 30 minutes before sexual activity  27 tablet  3  . DISCONTD: pantoprazole (PROTONIX) 40 MG tablet Take 1 tablet (40 mg total) by mouth daily.  90 tablet  3       Review of Systems Review of Systems  Constitutional: Negative for fever, appetite change, fatigue and unexpected weight change.  Eyes: Negative for pain and visual disturbance.  Respiratory: Negative for cough and shortness of breath.   Cardiovascular: Negative for cp or palpitations    Gastrointestinal: Negative for nausea, diarrhea and constipation. pos for indigestion/ trouble swallowing/ neg for dark stool Genitourinary: Negative for urgency and frequency.  Skin: Negative for pallor or rash   Neurological: Negative for weakness, light-headedness, numbness and headaches.  Hematological: Negative for adenopathy. Does not bruise/bleed easily.  Psychiatric/Behavioral: Negative for dysphoric mood. The patient has anxiety and social phobia worse lately         Objective:   Physical Exam  Constitutional: He appears  well-developed and well-nourished. No distress.  HENT:  Head: Normocephalic and atraumatic.  Mouth/Throat: Oropharynx is clear and moist. No oropharyngeal exudate.  Eyes: Conjunctivae and EOM are normal. Pupils are equal, round, and reactive to light. No scleral icterus.  Neck: Normal range of motion. Neck supple. No JVD present. No thyromegaly present.  Cardiovascular: Normal rate and regular rhythm.  Exam reveals no gallop.   Pulmonary/Chest: Effort normal and breath sounds normal. No respiratory distress. He has no wheezes.  Abdominal: Soft. Bowel sounds are normal. He exhibits no distension and no mass. There is no tenderness. There is no rebound.  Lymphadenopathy:    He has no  cervical adenopathy.  Neurological: He is alert.  Skin: Skin is warm and dry. No rash noted. No erythema. No pallor.  Psychiatric: He has a normal mood and affect.          Assessment & Plan:

## 2012-04-28 NOTE — Assessment & Plan Note (Signed)
Much worse lately with acid indigestion/ epigastric burning and some dysphagia despite daily protonix  Correlates with stress (he may come back to discuss that) Will inc protonix to bid  Ref to GI Diet ref

## 2012-04-28 NOTE — Assessment & Plan Note (Signed)
Likely due to worse GERD  Will ref to GI - consid EGD --? [employer] PPI to bid  Will update if worse

## 2012-05-30 ENCOUNTER — Ambulatory Visit: Payer: BC Managed Care – PPO | Admitting: Internal Medicine

## 2012-07-04 ENCOUNTER — Other Ambulatory Visit: Payer: Self-pay

## 2012-07-04 MED ORDER — FLUTICASONE PROPIONATE 50 MCG/ACT NA SUSP: 2.0000 | Freq: Every day | NASAL | Status: DC

## 2012-07-04 NOTE — Telephone Encounter (Signed)
Pt request Flonase #16 g sent to [facility] [address]. No more refills(refills expired) [facility].Pt notified refill done while on phone.

## 2012-09-22 ENCOUNTER — Other Ambulatory Visit: Payer: Self-pay

## 2012-09-22 DIAGNOSIS — K219 Gastro-esophageal reflux disease without esophagitis: Secondary | ICD-10-CM

## 2012-09-22 MED ORDER — PANTOPRAZOLE SODIUM 40 MG PO TBEC: 40.0000 mg | DELAYED_RELEASE_TABLET | Freq: Two times a day (BID) | ORAL | Status: DC

## 2012-09-22 NOTE — Telephone Encounter (Signed)
Mario Lawrence request refill for 1 month sent to [facility] [address] pt almost out of med. Mario Lawrence will request [facility] [address] to get [facility]

## 2013-01-05 ENCOUNTER — Other Ambulatory Visit: Payer: Self-pay | Admitting: Family Medicine

## 2013-01-05 NOTE — Telephone Encounter (Signed)
Ok to refill? No recent appt and no future appts 

## 2013-01-05 NOTE — Telephone Encounter (Signed)
Schedule PE in summer and refil until then, thanks

## 2013-03-13 ENCOUNTER — Other Ambulatory Visit: Payer: Self-pay

## 2013-03-13 MED ORDER — TADALAFIL 20 MG PO TABS: 20.0000 mg | ORAL_TABLET | Freq: Every day | ORAL | Status: DC | PRN

## 2013-03-13 NOTE — Telephone Encounter (Signed)
Pt left v/m requesting refill cialis to [facility] [address]. Pt has CPX scheduled 05/15/13.Please advise.

## 2013-03-13 NOTE — Telephone Encounter (Signed)
Done, left voicemail letting pt know Rx sent to pharmacy

## 2013-03-13 NOTE — Telephone Encounter (Signed)
Please go ahead and refil until then- thanks

## 2013-05-09 ENCOUNTER — Telehealth: Payer: Self-pay | Admitting: Family Medicine

## 2013-05-09 DIAGNOSIS — E785 Hyperlipidemia, unspecified: Secondary | ICD-10-CM

## 2013-05-09 DIAGNOSIS — Z0000 Encounter for general adult medical examination without abnormal findings: Secondary | ICD-10-CM

## 2013-05-09 NOTE — Telephone Encounter (Signed)
Message copied by Judy Pimple on Wed May 09, 2013 10:30 PM ------      Message from: Alvina Chou      Created: Wed Apr 25, 2013  4:45 PM      Regarding: Lab orders for Thursday, 6.5.14       Patient is scheduled for CPX labs, please order future labs, Thanks , Terri       ------

## 2013-05-10 ENCOUNTER — Other Ambulatory Visit (INDEPENDENT_AMBULATORY_CARE_PROVIDER_SITE_OTHER): Payer: BC Managed Care – PPO

## 2013-05-10 DIAGNOSIS — E785 Hyperlipidemia, unspecified: Secondary | ICD-10-CM

## 2013-05-10 DIAGNOSIS — Z0000 Encounter for general adult medical examination without abnormal findings: Secondary | ICD-10-CM

## 2013-05-10 LAB — COMPREHENSIVE METABOLIC PANEL
ALT: 25 U/L (ref 0–53)
Alkaline Phosphatase: 57 U/L (ref 39–117)
CO2: 29 mEq/L (ref 19–32)
Creatinine, Ser: 1.2 mg/dL (ref 0.4–1.5)
GFR: 74.87 mL/min (ref >60.00)
Sodium: 143 mEq/L (ref 135–145)
Total Bilirubin: 0.8 mg/dL (ref 0.3–1.2)

## 2013-05-10 LAB — CBC WITH DIFFERENTIAL/PLATELET
Basophils Absolute: 0 10*3/uL (ref 0.0–0.1)
HCT: 47.8 % (ref 39.0–52.0)
Hemoglobin: 16.1 g/dL (ref 13.0–17.0)
Lymphs Abs: 1.8 10*3/uL (ref 0.7–4.0)
MCHC: 33.7 g/dL (ref 30.0–36.0)
MCV: 95.6 fl (ref 78.0–100.0)
Monocytes Absolute: 0.7 10*3/uL (ref 0.1–1.0)
Monocytes Relative: 12.1 % — ABNORMAL HIGH (ref 3.0–12.0)
Neutro Abs: 2.7 10*3/uL (ref 1.4–7.7)
Platelets: 251 10*3/uL (ref 150.0–400.0)
RDW: 13.1 % (ref 11.5–14.6)

## 2013-05-10 LAB — LIPID PANEL
Cholesterol: 178 mg/dL (ref 0–200)
HDL: 38.6 mg/dL — ABNORMAL LOW (ref >39.00)
LDL Cholesterol: 122 mg/dL — ABNORMAL HIGH (ref 0–99)
VLDL: 17.4 mg/dL (ref 0.0–40.0)

## 2013-05-15 ENCOUNTER — Ambulatory Visit (INDEPENDENT_AMBULATORY_CARE_PROVIDER_SITE_OTHER): Payer: BC Managed Care – PPO | Admitting: Family Medicine

## 2013-05-15 ENCOUNTER — Encounter: Payer: Self-pay | Admitting: Family Medicine

## 2013-05-15 VITALS — BP 110/62 | HR 68 | Temp 98.9°F | Ht 73.75 in | Wt 217.5 lb

## 2013-05-15 DIAGNOSIS — Z800 Family history of malignant neoplasm of digestive organs: Secondary | ICD-10-CM | POA: Insufficient documentation

## 2013-05-15 DIAGNOSIS — E785 Hyperlipidemia, unspecified: Secondary | ICD-10-CM

## 2013-05-15 DIAGNOSIS — Z0000 Encounter for general adult medical examination without abnormal findings: Secondary | ICD-10-CM

## 2013-05-15 MED ORDER — TADALAFIL 20 MG PO TABS: 20.0000 mg | ORAL_TABLET | Freq: Every day | ORAL | Status: DC | PRN

## 2013-05-15 MED ORDER — FLUTICASONE PROPIONATE 50 MCG/ACT NA SUSP: 2.0000 | Freq: Every day | NASAL | Status: DC

## 2013-05-15 MED ORDER — ALBUTEROL SULFATE HFA 108 (90 BASE) MCG/ACT IN AERS: 2.0000 | INHALATION_SPRAY | RESPIRATORY_TRACT | Status: DC | PRN

## 2013-05-15 MED ORDER — PANTOPRAZOLE SODIUM 40 MG PO TBEC: 40.0000 mg | DELAYED_RELEASE_TABLET | Freq: Two times a day (BID) | ORAL | Status: DC

## 2013-05-15 NOTE — Assessment & Plan Note (Signed)
Reviewed health habits including diet and exercise and skin cancer prevention Also reviewed health mt list, fam hx and immunizations  Wellness labs reviewed If back pain does not improve -he will call

## 2013-05-15 NOTE — Progress Notes (Signed)
Subjective:    Patient ID: Mario Lawrence, male    DOB: 28-Jan-1971, 42 y.o.   MRN: [id number]  HPI Here for health maintenance exam and to review chronic medical problems    Wt is up 4 lb with bmi of 28 Since last year  Does eat a healthy diet - does not eat a lot of junk food or sweets    Did not get a flu vaccine this year - but usually does  Td 4/11  Family hx of colon cancer  Will turn 42 later this month  No stool changes at all  Wants to wait for January to schedule it   Labs Hyperlipidemia Lab Results  Component Value Date   CHOL 178 05/10/2013   CHOL 189 02/01/2012   CHOL 191 04/02/2011   Lab Results  Component Value Date   HDL 38.60* 05/10/2013   HDL 21.30 02/01/2012   HDL 86.57 04/02/2011   Lab Results  Component Value Date   LDLCALC 122* 05/10/2013   LDLCALC 122* 02/01/2012   LDLCALC 132* 04/02/2011   Lab Results  Component Value Date   TRIG 87.0 05/10/2013   TRIG 105.0 02/01/2012   TRIG 89.0 04/02/2011   Lab Results  Component Value Date   CHOLHDL 5 05/10/2013   CHOLHDL 4 02/01/2012   CHOLHDL 5 04/02/2011   Lab Results  Component Value Date   LDLDIRECT 153.7 12/11/2009  HDL is down  Was exercising more this year- joined the gym and also yard work No exercise in 2 weeks due to back pain  Does not eat a lot of fish     Has some back pain  This is new  Worked in the yard last Saturday- and woke up with pain the next day Hurts to sit and to get up from sitting (better with standing and walking) No numbness or weakness  Ibuprofen - as needed or relafen  No ice or heat  L low back   Patient Active Problem List   Diagnosis Date Noted  . Family history of colon cancer 05/15/2013  . Dysphagia 04/28/2012  . Routine general medical examination at a health care facility 03/25/2011  . Seasonal allergies   . HYPERLIPIDEMIA 03/13/2010  . ERECTILE DYSFUNCTION 12/11/2009  . ASTHMA 12/11/2009  . GERD 12/11/2009   Past Medical History  Diagnosis Date  .  GERD (gastroesophageal reflux disease)   . Asthma   . ED (erectile dysfunction)   . Seasonal allergies   . Hyperlipidemia   . Dysphagia    No past surgical history on file. History  Substance Use Topics  . Smoking status: Former Smoker    Quit date: 12/06/2008  . Smokeless tobacco: Not on file  . Alcohol Use: Yes     Comment: Occasional-weekends   Family History  Problem Relation Age of Onset  . Ovarian cancer Maternal Grandmother   . Lung cancer Maternal Grandfather   . COPD Maternal Grandfather   . Colon cancer Father   . Ovarian cancer Paternal Grandmother    Allergies  Allergen Reactions  . Codeine Nausea Only   No current outpatient prescriptions on file prior to visit.   No current facility-administered medications on file prior to visit.    Review of Systems    Review of Systems  Constitutional: Negative for fever, appetite change, fatigue and unexpected weight change.  Eyes: Negative for pain and visual disturbance.  Respiratory: Negative for cough and shortness of breath.   Cardiovascular: Negative for cp or  palpitations    Gastrointestinal: Negative for nausea, diarrhea and constipation.  Genitourinary: Negative for urgency and frequency.  Skin: Negative for pallor or rash   MSK pos for low back pain , neg for joint changes or swelling  Neurological: Negative for weakness, light-headedness, numbness and headaches.  Hematological: Negative for adenopathy. Does not bruise/bleed easily.  Psychiatric/Behavioral: Negative for dysphoric mood. The patient is not nervous/anxious.      Objective:   Physical Exam  Constitutional: He appears well-developed and well-nourished. No distress.  HENT:  Head: Normocephalic and atraumatic.  Mouth/Throat: Oropharynx is clear and moist.  Eyes: Conjunctivae and EOM are normal. Pupils are equal, round, and reactive to light. Right eye exhibits no discharge. Left eye exhibits no discharge. No scleral icterus.  Neck: Normal  range of motion. Neck supple. No JVD present. Carotid bruit is not present. No thyromegaly present.  Cardiovascular: Normal rate, regular rhythm, normal heart sounds and intact distal pulses.  Exam reveals no gallop.   Pulmonary/Chest: Effort normal and breath sounds normal. No respiratory distress. He has no wheezes.  Abdominal: Soft. Bowel sounds are normal. He exhibits no distension, no abdominal bruit and no mass. There is no tenderness.  Musculoskeletal: He exhibits no edema and no tenderness.  Limited rom of LS with tenderness in L musculature and neg SLR for extremity  pain   Lymphadenopathy:    He has no cervical adenopathy.  Neurological: He is alert. He has normal reflexes. No cranial nerve deficit. He exhibits normal muscle tone. Coordination normal.  Skin: Skin is warm and dry. No rash noted. No erythema. No pallor.  Psychiatric: He has a normal mood and affect.          Assessment & Plan:

## 2013-05-15 NOTE — Assessment & Plan Note (Signed)
Will call to schedule first screening colonoscopy in January when his schedule permits No symptoms  Father had colon cancer

## 2013-05-15 NOTE — Patient Instructions (Signed)
Avoid red meat/ fried foods/ egg yolks/ fatty breakfast meats/ butter, cheese and high fat dairy/ and shellfish   Take fish oil (omega 3) and exercise to raise your HDL/ good cholesterol Call use early in the year to schedule your first screening colonoscopy (due to family history)  Take care of yourself

## 2013-05-15 NOTE — Assessment & Plan Note (Signed)
Disc goals for lipids and reasons to control them Rev labs with pt Rev low sat fat diet in detail Adv exercise and fish oil/ omega 3 to help raise HDL

## 2013-05-23 ENCOUNTER — Telehealth: Payer: Self-pay | Admitting: Family Medicine

## 2013-05-23 DIAGNOSIS — H5789 Other specified disorders of eye and adnexa: Secondary | ICD-10-CM

## 2013-05-23 NOTE — Telephone Encounter (Signed)
Talked to [facility] about pt needing referral for eye, Shirlee Limerick already has an eye doc appt set up for pt tomorrow at 8:00 am, she just needs you to put referral in system

## 2013-05-23 NOTE — Telephone Encounter (Signed)
Pt's wife advise of Dr. Royden Purl recommendations and she advised me that pt doesn't have an eye doctor and would like a referral but given his sxs pt's wife wants to know if an eye doctor would be able to see him ASAP

## 2013-05-23 NOTE — Telephone Encounter (Signed)
I actually advise going to eye doctor - let me know and I can refer - does he have someone he has seen before?

## 2013-05-23 NOTE — Telephone Encounter (Signed)
Patient Information:  Caller Name: Mario Lawrence  Phone: [phone]  Patient: Mario Lawrence, Mario Lawrence  Gender: Male  DOB: May 05, 1971  Age: 42 Years  PCP: Roxy Manns [facility])  Office Follow Up:  Does the office need to follow up with this patient?: Yes  Instructions For The Office: Please call back regarding work-in appointment. Needs 30-45 minutes to get to office.   RN Note:  Requests appointment 05/23/13 for left eye redness for approximately 2 weeks without discharge, pain or vision changes.  No appointments remain.  Message sent to office staff for call back regarding work-in appointment.   Symptoms  Reason For Call & Symptoms: Called to request earlier appointment for bloodshot Left eye present for 2 weeks. Is scheduled for 05/29/13 with Dr Sharen Hones  but does not want to wait that long.   Denies eye pain or itching.  No discharge. No vision loss or blurred vision.  Reviewed Health History In EMR: Yes  Reviewed Medications In EMR: Yes  Reviewed Allergies In EMR: Yes  Reviewed Surgeries / Procedures: Yes  Date of Onset of Symptoms: 05/09/2013  Treatments Tried: Visine eye drops  Treatments Tried Worked: No  Guideline(s) Used:  Eye - Red Without Pus  Disposition Per Guideline:   See Today in Office  Reason For Disposition Reached:   Patient wants to be seen  Advice Given:  Red Eye Caused by Mild Irritant  Reassurance: Most eye irritants cause transient redness of the eyes.  Face Cleansing: Wash the face, then the eyelids, with a mild soap and water. This will remove any irritants. Also try to avoid the irritant.  Eye Irrigation: Irrigate the eye with warm water for 2-3 minutes.  Call Back If:  Blurred vision or increasing pain  No improvement  You become worse.  Patient Will Follow Care Advice:  YES

## 2013-05-23 NOTE — Telephone Encounter (Signed)
Referral done

## 2013-05-29 ENCOUNTER — Other Ambulatory Visit: Payer: Self-pay | Admitting: *Deleted

## 2013-05-29 ENCOUNTER — Ambulatory Visit: Payer: BC Managed Care – PPO | Admitting: Family Medicine

## 2013-05-29 MED ORDER — PANTOPRAZOLE SODIUM 40 MG PO TBEC: 40.0000 mg | DELAYED_RELEASE_TABLET | Freq: Two times a day (BID) | ORAL | Status: DC

## 2013-08-31 ENCOUNTER — Other Ambulatory Visit: Payer: Self-pay | Admitting: Family Medicine

## 2013-08-31 MED ORDER — FLUTICASONE PROPIONATE 50 MCG/ACT NA SUSP: 2.0000 | Freq: Every day | NASAL | Status: DC

## 2013-10-11 ENCOUNTER — Other Ambulatory Visit: Payer: Self-pay

## 2013-11-09 ENCOUNTER — Ambulatory Visit (INDEPENDENT_AMBULATORY_CARE_PROVIDER_SITE_OTHER): Payer: Self-pay | Admitting: Family Medicine

## 2013-11-09 ENCOUNTER — Encounter: Payer: Self-pay | Admitting: Family Medicine

## 2013-11-09 VITALS — BP 132/84 | HR 130 | Temp 99.2°F | Ht 73.75 in | Wt 216.5 lb

## 2013-11-09 DIAGNOSIS — J209 Acute bronchitis, unspecified: Secondary | ICD-10-CM | POA: Insufficient documentation

## 2013-11-09 MED ORDER — HYDROCODONE-HOMATROPINE 5-1.5 MG/5ML PO SYRP: 5.0000 mL | ORAL_SOLUTION | Freq: Three times a day (TID) | ORAL | Status: DC | PRN

## 2013-11-09 MED ORDER — PREDNISONE 10 MG PO TABS: ORAL_TABLET | ORAL | Status: DC

## 2013-11-09 MED ORDER — AZITHROMYCIN 250 MG PO TABS: ORAL_TABLET | ORAL | Status: DC

## 2013-11-09 NOTE — Patient Instructions (Signed)
I think you have bronchitis - which has flared your asthma Rest and fluids Continue breathing treatments  Take prednisone as directed  Take zithromax as directed   Use cough medicine with caution as it will sedate If you worsen - let us know and seek care if after hours in ER

## 2013-11-09 NOTE — Progress Notes (Signed)
Subjective:    Patient ID: Mario Lawrence, male    DOB: 09-10-1971, 42 y.o.   MRN: [id number]  HPI Here with uri symptoms  Started Monday  Still worked all week   Mostly in chest -not a lot of sinus symptoms  occ coughs up a bit of phlegm NMTs help- and using albuterol-this helps wheezing   Now persistent cough and low grade fever  It was higher last night - no thermometer- had chills and sweats   Otc: took nyquil at night and mucinex during the day (severe chest formula)   Patient Active Problem List   Diagnosis Date Noted  . Eye redness 05/23/2013  . Family history of colon cancer 05/15/2013  . Dysphagia 04/28/2012  . Routine general medical examination at a health care facility 03/25/2011  . Seasonal allergies   . HYPERLIPIDEMIA 03/13/2010  . ERECTILE DYSFUNCTION 12/11/2009  . ASTHMA 12/11/2009  . GERD 12/11/2009   Past Medical History  Diagnosis Date  . GERD (gastroesophageal reflux disease)   . Asthma   . ED (erectile dysfunction)   . Seasonal allergies   . Hyperlipidemia   . Dysphagia    No past surgical history on file. History  Substance Use Topics  . Smoking status: Former Smoker    Quit date: 12/06/2008  . Smokeless tobacco: Not on file  . Alcohol Use: Yes     Comment: Occasional-weekends   Family History  Problem Relation Age of Onset  . Ovarian cancer Maternal Grandmother   . Lung cancer Maternal Grandfather   . COPD Maternal Grandfather   . Colon cancer Father   . Ovarian cancer Paternal Grandmother    Allergies  Allergen Reactions  . Codeine Nausea Only   Current Outpatient Prescriptions on File Prior to Visit  Medication Sig Dispense Refill  . albuterol (VENTOLIN HFA) 108 (90 BASE) MCG/ACT inhaler Inhale 2 puffs into the lungs every 4 (four) hours as needed.  1 Inhaler  5  . fluticasone (FLONASE) 50 MCG/ACT nasal spray Place 2 sprays into the nose daily.  48 g  1  . pantoprazole (PROTONIX) 40 MG tablet Take 1 tablet (40 mg total)  by mouth 2 (two) times daily.  180 tablet  3  . tadalafil (CIALIS) 20 MG tablet Take 1 tablet (20 mg total) by mouth daily as needed for erectile dysfunction. Take by mouth as directed 30 minutes before sexual activity  27 tablet  3   No current facility-administered medications on file prior to visit.      Review of Systems Review of Systems  Constitutional: Negative for appetite change, and unexpected weight change.  ENT pos for post nasal drip and neg for sinus pain  Eyes: Negative for pain and visual disturbance.  Respiratory: Negative for shortness of breath.  pos for cough and wheeze  Cardiovascular: Negative for cp or palpitations    Gastrointestinal: Negative for nausea, diarrhea and constipation.  Genitourinary: Negative for urgency and frequency.  Skin: Negative for pallor or rash   Neurological: Negative for weakness, light-headedness, numbness and headaches.  Hematological: Negative for adenopathy. Does not bruise/bleed easily.  Psychiatric/Behavioral: Negative for dysphoric mood. The patient is not nervous/anxious.         Objective:   Physical Exam  Constitutional: He appears well-developed and well-nourished.  HENT:  Head: Normocephalic and atraumatic.  Eyes: Conjunctivae and EOM are normal. Pupils are equal, round, and reactive to light. Right eye exhibits no discharge. Left eye exhibits no discharge.  Neck: Normal  range of motion. Neck supple.  Cardiovascular: Normal rate and regular rhythm.   Pulmonary/Chest: Effort normal. No respiratory distress. He has wheezes. He has no rales.  Diffuse exp wheeze without prolonged exp phase  Few rhonchi    Lymphadenopathy:    He has no cervical adenopathy.  Neurological: He is alert.  Skin: Skin is warm and dry. No rash noted.  Psychiatric: He has a normal mood and affect.          Assessment & Plan:

## 2013-11-09 NOTE — Progress Notes (Signed)
Pre-visit discussion using our clinic review tool. No additional management support is needed unless otherwise documented below in the visit note.  

## 2013-11-11 NOTE — Assessment & Plan Note (Signed)
In pt with hx of asthma  Cover with zpak and pred taper Cough control- hycodan with caution  Disc symptomatic care - see instructions on AVS  Update if not starting to improve in a week or if worsening

## 2014-03-15 ENCOUNTER — Other Ambulatory Visit: Payer: Self-pay

## 2014-03-15 NOTE — Telephone Encounter (Signed)
Pt request refill Cialis today to [facility] [address]. Pt has CPX scheduled 08/19/14.Please advise.

## 2014-03-16 MED ORDER — TADALAFIL 20 MG PO TABS: 20.0000 mg | ORAL_TABLET | Freq: Every day | ORAL | Status: DC | PRN

## 2014-04-24 ENCOUNTER — Ambulatory Visit (INDEPENDENT_AMBULATORY_CARE_PROVIDER_SITE_OTHER): Payer: BC Managed Care – PPO | Admitting: Family Medicine

## 2014-04-24 ENCOUNTER — Encounter: Payer: Self-pay | Admitting: Family Medicine

## 2014-04-24 VITALS — BP 130/88 | HR 76 | Temp 98.3°F | Ht 73.75 in | Wt 215.0 lb

## 2014-04-24 DIAGNOSIS — H02846 Edema of left eye, unspecified eyelid: Secondary | ICD-10-CM | POA: Insufficient documentation

## 2014-04-24 DIAGNOSIS — H5710 Ocular pain, unspecified eye: Secondary | ICD-10-CM

## 2014-04-24 DIAGNOSIS — H02849 Edema of unspecified eye, unspecified eyelid: Secondary | ICD-10-CM

## 2014-04-24 DIAGNOSIS — H5712 Ocular pain, left eye: Secondary | ICD-10-CM | POA: Insufficient documentation

## 2014-04-24 NOTE — Patient Instructions (Signed)
Stop up front for referral to opthalmology for eval of your eye today

## 2014-04-24 NOTE — Progress Notes (Signed)
Pre visit review using our clinic review tool, if applicable. No additional management support is needed unless otherwise documented below in the visit note. 

## 2014-04-24 NOTE — Progress Notes (Signed)
Subjective:    Patient ID: Mario HensenChristopher S Lawrence, male    DOB: [birth year], 43 y.o.   MRN: [id number]  HPI Here with a red swollen eye left   He laid down and felt something go in his eye - ? Wonders if he was bitten by something  Tried to get it out -but did not look in mirror  Woke up with a red and swollen eye  A little blurry and has a little headache , the whole area hurts -it is throbbing  Eye is watering and also has a yellow /green discharge    Patient Active Problem List   Diagnosis Date Noted  . Acute bronchitis with bronchospasm 11/09/2013  . Eye redness 05/23/2013  . Family history of colon cancer 05/15/2013  . Dysphagia 04/28/2012  . Routine general medical examination at a health care facility 03/25/2011  . Seasonal allergies   . HYPERLIPIDEMIA 03/13/2010  . ERECTILE DYSFUNCTION 12/11/2009  . ASTHMA 12/11/2009  . GERD 12/11/2009   Past Medical History  Diagnosis Date  . GERD (gastroesophageal reflux disease)   . Asthma   . ED (erectile dysfunction)   . Seasonal allergies   . Hyperlipidemia   . Dysphagia    No past surgical history on file. History  Substance Use Topics  . Smoking status: Former Smoker    Quit date: 12/06/2008  . Smokeless tobacco: Not on file  . Alcohol Use: Yes     Comment: Occasional-weekends   Family History  Problem Relation Age of Onset  . Ovarian cancer Maternal Grandmother   . Lung cancer Maternal Grandfather   . COPD Maternal Grandfather   . Colon cancer Father   . Ovarian cancer Paternal Grandmother    Allergies  Allergen Reactions  . Codeine Nausea Only   Current Outpatient Prescriptions on File Prior to Visit  Medication Sig Dispense Refill  . albuterol (VENTOLIN HFA) 108 (90 BASE) MCG/ACT inhaler Inhale 2 puffs into the lungs every 4 (four) hours as needed.  1 Inhaler  5  . fluticasone (FLONASE) 50 MCG/ACT nasal spray Place 2 sprays into the nose daily.  48 g  1  . HYDROcodone-homatropine (HYCODAN) 5-1.5 MG/5ML  syrup Take 5 mLs by mouth every 8 (eight) hours as needed for cough (watch out for sedation and take with food).  120 mL  0  . pantoprazole (PROTONIX) 40 MG tablet Take 1 tablet (40 mg total) by mouth 2 (two) times daily.  180 tablet  3  . predniSONE (DELTASONE) 10 MG tablet Take 3 pills once daily by mouth for 3 days, then 2 pills once daily for 3 days, then 1 pill once daily for 3 days and then stop  18 tablet  0  . tadalafil (CIALIS) 20 MG tablet Take 1 tablet (20 mg total) by mouth daily as needed for erectile dysfunction. Take by mouth as directed 30 minutes before sexual activity  27 tablet  3   No current facility-administered medications on file prior to visit.     Review of Systems Review of Systems  Constitutional: Negative for fever, appetite change, fatigue and unexpected weight change.  Eyes: pos for pain/ itching/swelling and vis dist in L eye  Respiratory: Negative for cough and shortness of breath.   Cardiovascular: Negative for cp or palpitations    Gastrointestinal: Negative for nausea, diarrhea and constipation.  Genitourinary: Negative for urgency and frequency.  Skin: Negative for pallor or rash   Neurological: Negative for weakness, light-headedness, numbness and headaches.  Hematological: Negative for adenopathy. Does not bruise/bleed easily.  Psychiatric/Behavioral: Negative for dysphoric mood. The patient is not nervous/anxious.         Objective:   Physical Exam  Constitutional: He appears well-developed and well-nourished. No distress.  HENT:  Head: Normocephalic and atraumatic.  Mouth/Throat: Oropharynx is clear and moist.  Eyes: EOM are normal. Pupils are equal, round, and reactive to light. Right eye exhibits no discharge. Left eye exhibits discharge. No scleral icterus.  Diffuse conj injection of L eye with upper lid swelling and cloudy discharge  Nl EOM Vision is blurry (self reported) No obvious FB in eye visualized   Neck: Normal range of motion.  Neck supple.  Cardiovascular: Normal rate and regular rhythm.   Lymphadenopathy:    He has no cervical adenopathy.  Skin: Skin is warm and dry. No rash noted. No erythema.  Psychiatric: He has a normal mood and affect.          Assessment & Plan:

## 2014-04-25 NOTE — Assessment & Plan Note (Signed)
See assessment for swollen lid Urgent opth ref

## 2014-04-25 NOTE — Assessment & Plan Note (Signed)
After ? Injury or FB to eye last night -abrupt onset with pain and erythema and vision change and d/c  Suspect conj abrasion  Given vision change -likely needs slit lamp exam Ref urgently to opth

## 2014-05-18 ENCOUNTER — Emergency Department (HOSPITAL_COMMUNITY)
Admission: EM | Admit: 2014-05-18 | Discharge: 2014-05-18 | Disposition: A | Discharge status: Home / Self Care | Payer: BC Managed Care – PPO | Attending: Emergency Medicine | Admitting: Emergency Medicine

## 2014-05-18 ENCOUNTER — Encounter (HOSPITAL_COMMUNITY): Payer: Self-pay | Admitting: Emergency Medicine

## 2014-05-18 DIAGNOSIS — Z8639 Personal history of other endocrine, nutritional and metabolic disease: Secondary | ICD-10-CM | POA: Insufficient documentation

## 2014-05-18 DIAGNOSIS — T7840XA Allergy, unspecified, initial encounter: Secondary | ICD-10-CM

## 2014-05-18 DIAGNOSIS — J45901 Unspecified asthma with (acute) exacerbation: Secondary | ICD-10-CM | POA: Insufficient documentation

## 2014-05-18 DIAGNOSIS — K219 Gastro-esophageal reflux disease without esophagitis: Secondary | ICD-10-CM | POA: Insufficient documentation

## 2014-05-18 DIAGNOSIS — Z87891 Personal history of nicotine dependence: Secondary | ICD-10-CM | POA: Insufficient documentation

## 2014-05-18 DIAGNOSIS — IMO0002 Reserved for concepts with insufficient information to code with codable children: Secondary | ICD-10-CM | POA: Insufficient documentation

## 2014-05-18 DIAGNOSIS — Z862 Personal history of diseases of the blood and blood-forming organs and certain disorders involving the immune mechanism: Secondary | ICD-10-CM | POA: Insufficient documentation

## 2014-05-18 DIAGNOSIS — L272 Dermatitis due to ingested food: Secondary | ICD-10-CM | POA: Insufficient documentation

## 2014-05-18 DIAGNOSIS — Z79899 Other long term (current) drug therapy: Secondary | ICD-10-CM | POA: Insufficient documentation

## 2014-05-18 DIAGNOSIS — R109 Unspecified abdominal pain: Secondary | ICD-10-CM | POA: Insufficient documentation

## 2014-05-18 DIAGNOSIS — Z87448 Personal history of other diseases of urinary system: Secondary | ICD-10-CM | POA: Insufficient documentation

## 2014-05-18 DIAGNOSIS — R110 Nausea: Secondary | ICD-10-CM | POA: Insufficient documentation

## 2014-05-18 MED ORDER — METHYLPREDNISOLONE (PAK) 4 MG PO TABS: ORAL_TABLET | ORAL | Status: DC

## 2014-05-18 MED ORDER — DIPHENHYDRAMINE HCL 25 MG PO TABS: 50.0000 mg | ORAL_TABLET | ORAL | Status: AC | PRN

## 2014-05-18 MED ORDER — METHYLPREDNISOLONE SODIUM SUCC 125 MG IJ SOLR
125.0000 mg | Freq: Once | INTRAMUSCULAR | Status: AC
  Administered 2014-05-18: 125 mg via INTRAVENOUS
  Filled 2014-05-18: qty 2

## 2014-05-18 MED ORDER — EPINEPHRINE 0.3 MG/0.3ML IJ SOAJ: 0.3000 mg | INTRAMUSCULAR | Status: DC | PRN

## 2014-05-18 NOTE — ED Provider Notes (Signed)
CSN: [id number]     Arrival date & time 05/18/14  1958 History   None    Chief Complaint  Patient presents with  . Allergic Reaction     (Consider location/radiation/quality/duration/timing/severity/associated sxs/prior Treatment) Patient is a 43 y.o. male presenting with allergic reaction and general illness.  Allergic Reaction Presenting symptoms: difficulty breathing, itching, rash and wheezing   Severity:  Mild Prior allergic episodes:  Animal allergies Context: no animal exposure, no food allergies, no grass, no medications and no nuts   Relieved by:  Bronchodilators and antihistamines Illness Severity:  Mild Onset quality:  Sudden Progression:  Improving Associated symptoms: abdominal pain, nausea, rash, shortness of breath and wheezing   Associated symptoms: no chest pain, no congestion, no cough, no diarrhea, no fever, no headaches, no rhinorrhea and no vomiting     Past Medical History  Diagnosis Date  . GERD (gastroesophageal reflux disease)   . Asthma   . ED (erectile dysfunction)   . Seasonal allergies   . Hyperlipidemia   . Dysphagia    History reviewed. No pertinent past surgical history. Family History  Problem Relation Age of Onset  . Ovarian cancer Maternal Grandmother   . Lung cancer Maternal Grandfather   . COPD Maternal Grandfather   . Colon cancer Father   . Ovarian cancer Paternal Grandmother    History  Substance Use Topics  . Smoking status: Former Smoker    Quit date: 12/06/2008  . Smokeless tobacco: Not on file  . Alcohol Use: Yes     Comment: Occasional-weekends    Review of Systems  Constitutional: Negative for fever and chills.  HENT: Negative for congestion and rhinorrhea.   Eyes: Negative for pain.  Respiratory: Positive for shortness of breath and wheezing. Negative for cough.   Cardiovascular: Negative for chest pain and palpitations.  Gastrointestinal: Positive for nausea and abdominal pain. Negative for vomiting, diarrhea  and constipation.  Endocrine: Negative for polydipsia and polyuria.  Genitourinary: Negative for dysuria and flank pain.  Musculoskeletal: Negative for back pain and neck pain.  Skin: Positive for itching and rash. Negative for color change and wound.  Neurological: Negative for dizziness, numbness and headaches.      Allergies  Shrimp and Codeine  Home Medications   Prior to Admission medications   Medication Sig Start Date End Date Taking? Authorizing Provider  fluticasone (FLONASE) 50 MCG/ACT nasal spray Place 2 sprays into the nose daily. 08/31/13 08/31/14 Yes Marne A Tower, MD  loratadine (CLARITIN) 10 MG tablet Take 10 mg by mouth daily as needed for allergies.   Yes Historical Provider, MD  pantoprazole (PROTONIX) 40 MG tablet Take 1 tablet (40 mg total) by mouth 2 (two) times daily. 05/29/13  Yes Judy PimpleMarne A Tower, MD  tetrahydrozoline-zinc (VISINE-AC) 0.05-0.25 % ophthalmic solution Place 2 drops into both eyes 3 (three) times daily as needed.   Yes Historical Provider, MD  albuterol (VENTOLIN HFA) 108 (90 BASE) MCG/ACT inhaler Inhale 2 puffs into the lungs every 4 (four) hours as needed. 05/15/13   Judy PimpleMarne A Tower, MD  diphenhydrAMINE (BENADRYL) 25 MG tablet Take 2 tablets (50 mg total) by mouth every 4 (four) hours as needed for itching or allergies. 05/18/14   Marily MemosJason Jamarquis Crull, MD  EPINEPHrine (EPIPEN) 0.3 mg/0.3 mL IJ SOAJ injection Inject 0.3 mLs (0.3 mg total) into the muscle as needed. 05/18/14   Marily MemosJason Doral Ventrella, MD  methylPREDNIsolone (MEDROL DOSPACK) 4 MG tablet follow package directions 05/18/14   Marily MemosJason Luisdaniel Kenton, MD   BP 119/79  Pulse 74  Temp(Src) 98.5 F (36.9 C) (Oral)  Resp 17  Ht 6\' 2"  (1.88 m)  Wt 210 lb (95.255 kg)  BMI 26.95 kg/m2  SpO2 97% Physical Exam  Nursing note and vitals reviewed. Constitutional: He is oriented to person, place, and time. He appears well-developed and well-nourished.  HENT:  Head: Normocephalic and atraumatic.  Eyes: Conjunctivae and EOM are  normal. Pupils are equal, round, and reactive to light.  Neck: Normal range of motion.  Cardiovascular: Normal rate and regular rhythm.   Pulmonary/Chest: Effort normal and breath sounds normal.  Abdominal: Soft. He exhibits no distension. There is no tenderness.  Musculoskeletal: Normal range of motion. He exhibits no edema and no tenderness.  Neurological: He is alert and oriented to person, place, and time.  Skin: Skin is warm and dry. Rash (hives on upper chest, back and shoulders) noted.    ED Course  Procedures (including critical care time) Labs Review Labs Reviewed - No data to display  Imaging Review No results found.   EKG Interpretation None      MDM   Final diagnoses:  Allergic reaction    43 yo M w/ shrimp allergy tried shrimp tonight and started having nausea, abdominal pain and subsequently started having sob not relieved by albuterol and subsequently hives. ems called, benadryl and zantac improved symptoms. Improving here. VS WNL. No hypotension. Improving symptoms, no need for epi. Given steroids. Observed for an hour and symptoms resolved. rx for epi pen and steroids given.     Marily MemosJason Zenith Kercheval, MD 05/19/14 [phone]

## 2014-05-18 NOTE — ED Notes (Signed)
Patient arrived via [facility] from home after patient consumed shrimp at 5:30 pm tonight at by 6:30 pm patient started to have hives and shortness of breath. Patient used his inhaler x3 without any relief. EMS arrived and administered Benadryl 50mg  IV and Zantac 50mg  IV. Upon arrival to ED patient was having no respiratory distress and his hives have decreased. VSS, A/O.

## 2014-05-19 NOTE — ED Provider Notes (Signed)
I saw and evaluated the patient, reviewed the resident's note and I agree with the findings and plan.   EKG Interpretation None      Pt with allergic rxn to shrimp who received steroids and benadryl pta with improvements of sx.  Did not require epi.  Pt feeling well after short observation and d/ced home with epi pen and steroids.  Gwyneth SproutWhitney Sina Lucchesi, MD 05/19/14 [id number]

## 2014-06-01 ENCOUNTER — Other Ambulatory Visit: Payer: Self-pay | Admitting: Family Medicine

## 2014-08-12 ENCOUNTER — Telehealth: Payer: Self-pay | Admitting: Family Medicine

## 2014-08-12 DIAGNOSIS — Z0000 Encounter for general adult medical examination without abnormal findings: Secondary | ICD-10-CM

## 2014-08-12 NOTE — Telephone Encounter (Signed)
Message copied by Judy Pimple on Mon Aug 12, 2014 11:27 AM ------      Message from: Alvina Chou      Created: Mon Aug 05, 2014  3:48 PM      Regarding: Lab orders for Tuesday, 9.8.15       Patient is scheduled for CPX labs, please order future labs, Thanks , Terri       ------

## 2014-08-13 ENCOUNTER — Other Ambulatory Visit (INDEPENDENT_AMBULATORY_CARE_PROVIDER_SITE_OTHER): Payer: BC Managed Care – PPO

## 2014-08-13 DIAGNOSIS — Z0000 Encounter for general adult medical examination without abnormal findings: Secondary | ICD-10-CM

## 2014-08-13 LAB — LIPID PANEL
CHOLESTEROL: 166 mg/dL (ref 0–200)
HDL: 35.1 mg/dL — ABNORMAL LOW (ref >39.00)
LDL Cholesterol: 102 mg/dL — ABNORMAL HIGH (ref 0–99)
NonHDL: 130.9
TRIGLYCERIDES: 144 mg/dL (ref 0.0–149.0)
Total CHOL/HDL Ratio: 5
VLDL: 28.8 mg/dL (ref 0.0–40.0)

## 2014-08-13 LAB — COMPREHENSIVE METABOLIC PANEL
ALBUMIN: 4.1 g/dL (ref 3.5–5.2)
ALT: 22 U/L (ref 0–53)
AST: 24 U/L (ref 0–37)
Alkaline Phosphatase: 54 U/L (ref 39–117)
BUN: 20 mg/dL (ref 6–23)
CALCIUM: 9.5 mg/dL (ref 8.4–10.5)
CHLORIDE: 106 meq/L (ref 96–112)
CO2: 28 mEq/L (ref 19–32)
Creatinine, Ser: 1.3 mg/dL (ref 0.4–1.5)
GFR: 65.17 mL/min (ref >60.00)
GLUCOSE: 106 mg/dL — AB (ref 70–99)
POTASSIUM: 4.5 meq/L (ref 3.5–5.1)
Sodium: 142 mEq/L (ref 135–145)
Total Bilirubin: 0.5 mg/dL (ref 0.2–1.2)
Total Protein: 7 g/dL (ref 6.0–8.3)

## 2014-08-13 LAB — CBC WITH DIFFERENTIAL/PLATELET
BASOS PCT: 0.4 % (ref 0.0–3.0)
Basophils Absolute: 0 10*3/uL (ref 0.0–0.1)
Eosinophils Absolute: 0.3 10*3/uL (ref 0.0–0.7)
Eosinophils Relative: 7.5 % — ABNORMAL HIGH (ref 0.0–5.0)
HCT: 43.4 % (ref 39.0–52.0)
Hemoglobin: 14.9 g/dL (ref 13.0–17.0)
LYMPHS PCT: 30.6 % (ref 12.0–46.0)
Lymphs Abs: 1.4 10*3/uL (ref 0.7–4.0)
MCHC: 34.2 g/dL (ref 30.0–36.0)
MCV: 92.3 fl (ref 78.0–100.0)
Monocytes Absolute: 0.6 10*3/uL (ref 0.1–1.0)
Monocytes Relative: 13.1 % — ABNORMAL HIGH (ref 3.0–12.0)
Neutro Abs: 2.2 10*3/uL (ref 1.4–7.7)
Neutrophils Relative %: 48.4 % (ref 43.0–77.0)
Platelets: 246 10*3/uL (ref 150.0–400.0)
RBC: 4.7 Mil/uL (ref 4.22–5.81)
RDW: 13.3 % (ref 11.5–15.5)
WBC: 4.6 10*3/uL (ref 4.0–10.5)

## 2014-08-13 LAB — TSH: TSH: 1.5 u[IU]/mL (ref 0.35–4.50)

## 2014-08-19 ENCOUNTER — Encounter: Payer: Self-pay | Admitting: Family Medicine

## 2014-08-19 ENCOUNTER — Ambulatory Visit (INDEPENDENT_AMBULATORY_CARE_PROVIDER_SITE_OTHER): Payer: BC Managed Care – PPO | Admitting: Family Medicine

## 2014-08-19 VITALS — BP 122/80 | HR 65 | Temp 98.2°F | Ht 73.5 in | Wt 212.0 lb

## 2014-08-19 DIAGNOSIS — Z800 Family history of malignant neoplasm of digestive organs: Secondary | ICD-10-CM

## 2014-08-19 DIAGNOSIS — Z0000 Encounter for general adult medical examination without abnormal findings: Secondary | ICD-10-CM

## 2014-08-19 DIAGNOSIS — Z1211 Encounter for screening for malignant neoplasm of colon: Secondary | ICD-10-CM | POA: Insufficient documentation

## 2014-08-19 DIAGNOSIS — E785 Hyperlipidemia, unspecified: Secondary | ICD-10-CM

## 2014-08-19 DIAGNOSIS — Z23 Encounter for immunization: Secondary | ICD-10-CM

## 2014-08-19 MED ORDER — PANTOPRAZOLE SODIUM 40 MG PO TBEC: DELAYED_RELEASE_TABLET | ORAL | Status: DC

## 2014-08-19 MED ORDER — FLUTICASONE PROPIONATE 50 MCG/ACT NA SUSP: 2.0000 | Freq: Every day | NASAL | Status: DC

## 2014-08-19 MED ORDER — TADALAFIL 20 MG PO TABS: 20.0000 mg | ORAL_TABLET | Freq: Every day | ORAL | Status: DC | PRN

## 2014-08-19 NOTE — Progress Notes (Signed)
Subjective:    Patient ID: Mario Lawrence, male    DOB: June 09, 1971, 43 y.o.   MRN: [id number]  HPI Here for health maintenance exam and to review chronic medical problems    Has been doing very well  Taking good care of himself - but needs more time for exercise (would go to the gym if he had time) Does a lot of outdoor work however   [employer] is up 2 lb with bmi of 27  BP Readings from Last 3 Encounters:  08/19/14 134/82  05/18/14 119/79  04/24/14 130/88    Flu shot today Td 4/11 up to date   Father had colon cancer - has not set that up  Cannot schedule due to financial problems  Has to meet deductible before insurance will pay-cannot afford it now  Will do IFOB   Hyperlipidemia Lab Results  Component Value Date   CHOL 166 08/13/2014   CHOL 178 05/10/2013   CHOL 189 02/01/2012   Lab Results  Component Value Date   HDL 35.10* 08/13/2014   HDL 40.98* 05/10/2013   HDL 11.91 02/01/2012   Lab Results  Component Value Date   LDLCALC 102* 08/13/2014   LDLCALC 122* 05/10/2013   LDLCALC 122* 02/01/2012   Lab Results  Component Value Date   TRIG 144.0 08/13/2014   TRIG 87.0 05/10/2013   TRIG 105.0 02/01/2012   Lab Results  Component Value Date   CHOLHDL 5 08/13/2014   CHOLHDL 5 05/10/2013   CHOLHDL 4 02/01/2012   Lab Results  Component Value Date   LDLDIRECT 153.7 12/11/2009   cholesterol profile is fairly stable  No real changes in his diet  Stays away from fatty food   Glucose fasting 106 He did forget to fast   Other labs ok   Allergies are fair / not as bad this year   No prostate problems that he knows of  No nocturia  No problems with flow or emptying of bladder  No family hx of prostate cancer at all    Patient Active Problem List   Diagnosis Date Noted  . Colon cancer screening 08/19/2014  . Acute bronchitis with bronchospasm 11/09/2013  . Eye redness 05/23/2013  . Family history of colon cancer 05/15/2013  . Dysphagia 04/28/2012  . Routine general medical  examination at a health care facility 03/25/2011  . Seasonal allergies   . HYPERLIPIDEMIA 03/13/2010  . ERECTILE DYSFUNCTION 12/11/2009  . ASTHMA 12/11/2009  . GERD 12/11/2009   Past Medical History  Diagnosis Date  . GERD (gastroesophageal reflux disease)   . Asthma   . ED (erectile dysfunction)   . Seasonal allergies   . Hyperlipidemia   . Dysphagia    No past surgical history on file. History  Substance Use Topics  . Smoking status: Former Smoker    Quit date: 12/06/2008  . Smokeless tobacco: Not on file  . Alcohol Use: Yes     Comment: Occasional-weekends   Family History  Problem Relation Age of Onset  . Ovarian cancer Maternal Grandmother   . Lung cancer Maternal Grandfather   . COPD Maternal Grandfather   . Colon cancer Father   . Ovarian cancer Paternal Grandmother    Allergies  Allergen Reactions  . Shrimp [Shellfish Allergy] Hives and Shortness Of Breath  . Codeine Nausea Only   Current Outpatient Prescriptions on File Prior to Visit  Medication Sig Dispense Refill  . albuterol (VENTOLIN HFA) 108 (90 BASE) MCG/ACT inhaler Inhale 2 puffs  into the lungs every 4 (four) hours as needed.  1 Inhaler  5  . diphenhydrAMINE (BENADRYL) 25 MG tablet Take 2 tablets (50 mg total) by mouth every 4 (four) hours as needed for itching or allergies.  20 tablet  0  . EPINEPHrine (EPIPEN) 0.3 mg/0.3 mL IJ SOAJ injection Inject 0.3 mLs (0.3 mg total) into the muscle as needed.  1 Device  2  . loratadine (CLARITIN) 10 MG tablet Take 10 mg by mouth daily as needed for allergies.      Marland Kitchen tetrahydrozoline-zinc (VISINE-AC) 0.05-0.25 % ophthalmic solution Place 2 drops into both eyes 3 (three) times daily as needed.       No current facility-administered medications on file prior to visit.    Review of Systems Review of Systems  Constitutional: Negative for fever, appetite change, fatigue and unexpected weight change.  Eyes: Negative for pain and visual disturbance.  Respiratory:  Negative for cough and shortness of breath.   Cardiovascular: Negative for cp or palpitations    Gastrointestinal: Negative for nausea, diarrhea and constipation.  Genitourinary: Negative for urgency and frequency.  Skin: Negative for pallor or rash   Neurological: Negative for weakness, light-headedness, numbness and headaches.  Hematological: Negative for adenopathy. Does not bruise/bleed easily.  Psychiatric/Behavioral: Negative for dysphoric mood. The patient is not nervous/anxious.         Objective:   Physical Exam  Constitutional: He appears well-developed and well-nourished. No distress.  HENT:  Head: Normocephalic and atraumatic.  Right Ear: External ear normal.  Left Ear: External ear normal.  Nose: Nose normal.  Mouth/Throat: Oropharynx is clear and moist.  Nares are boggy  Eyes: Conjunctivae and EOM are normal. Pupils are equal, round, and reactive to light. Right eye exhibits no discharge. Left eye exhibits no discharge. No scleral icterus.  Neck: Normal range of motion. Neck supple. No JVD present. Carotid bruit is not present. No thyromegaly present.  Cardiovascular: Normal rate, regular rhythm, normal heart sounds and intact distal pulses.  Exam reveals no gallop.   Pulmonary/Chest: Effort normal and breath sounds normal. No respiratory distress. He has no wheezes. He exhibits no tenderness.  Abdominal: Soft. Bowel sounds are normal. He exhibits no distension, no abdominal bruit and no mass. There is no tenderness.  Genitourinary:  DRE deferred   Musculoskeletal: He exhibits no edema and no tenderness.  Lymphadenopathy:    He has no cervical adenopathy.  Neurological: He is alert. He has normal reflexes. No cranial nerve deficit. He exhibits normal muscle tone. Coordination normal.  Skin: Skin is warm and dry. No rash noted. No erythema. No pallor.  Solar lentigos diffusely   Psychiatric: He has a normal mood and affect.          Assessment & Plan:   Problem  List Items Addressed This Visit     Other   HYPERLIPIDEMIA     Disc goals for lipids and reasons to control them Rev labs with pt Rev low sat fat diet in detail  Overall good -but needs to raise HDL - disc strategy of exercise Omega 3 supplement/fish may also help     Relevant Medications      tadalafil (CIALIS) tablet   Routine general medical examination at a health care facility - Primary     Reviewed health habits including diet and exercise and skin cancer prevention Reviewed appropriate screening tests for age  Also reviewed health mt list, fam hx and immunization status , as well as social and family  history   See HPI Labs rev Flu shot today    Family history of colon cancer     ifob given  Cannot afford colonosc yet-will let us know when he can    Colon cancer screening     Pt is not ready/financially able to get colonoscopy yet Given IFOB card     Relevant Orders      Fecal occult blood, imunochemical    Other Visit Diagnoses   Need for prophylactic vaccination and inoculation against influenza        Relevant Orders       Flu Vaccine QUAD 36+ mos PF IM (Fluarix Quad PF) (Completed)

## 2014-08-19 NOTE — Assessment & Plan Note (Signed)
Pt is not ready/financially able to get colonoscopy yet Given IFOB card

## 2014-08-19 NOTE — Assessment & Plan Note (Signed)
Disc goals for lipids and reasons to control them Rev labs with pt Rev low sat fat diet in detail  Overall good -but needs to raise HDL - disc strategy of exercise Omega 3 supplement/fish may also help

## 2014-08-19 NOTE — Assessment & Plan Note (Signed)
ifob given  Cannot afford colonosc yet-will let us know when he can

## 2014-08-19 NOTE — Progress Notes (Signed)
Pre visit review using our clinic review tool, if applicable. No additional management support is needed unless otherwise documented below in the visit note. 

## 2014-08-19 NOTE — Assessment & Plan Note (Signed)
Reviewed health habits including diet and exercise and skin cancer prevention Reviewed appropriate screening tests for age  Also reviewed health mt list, fam hx and immunization status , as well as social and family history   See HPI Labs rev Flu shot today 

## 2014-08-19 NOTE — Patient Instructions (Signed)
Flu shot today  Keep working on healthy diet and exercise  Please do the IFOB stool card for colon cancer screening  Call us when you are ready to schedule your screening colonoscopy

## 2014-09-17 ENCOUNTER — Ambulatory Visit (INDEPENDENT_AMBULATORY_CARE_PROVIDER_SITE_OTHER): Payer: BC Managed Care – PPO | Admitting: Family Medicine

## 2014-09-17 ENCOUNTER — Encounter: Payer: Self-pay | Admitting: Family Medicine

## 2014-09-17 VITALS — BP 94/66 | HR 92 | Temp 98.8°F | Ht 73.5 in | Wt 217.5 lb

## 2014-09-17 DIAGNOSIS — J020 Streptococcal pharyngitis: Secondary | ICD-10-CM | POA: Insufficient documentation

## 2014-09-17 DIAGNOSIS — J029 Acute pharyngitis, unspecified: Secondary | ICD-10-CM

## 2014-09-17 LAB — POCT RAPID STREP A (OFFICE): Rapid Strep A Screen: NEGATIVE

## 2014-09-17 MED ORDER — AMOXICILLIN 500 MG PO CAPS: 1000.0000 mg | ORAL_CAPSULE | Freq: Two times a day (BID) | ORAL | Status: DC

## 2014-09-17 NOTE — Progress Notes (Signed)
   Subjective:    Patient ID: Mario Lawrence, male    DOB: 05-18-1971, 43 y.o.   MRN: [id number]  Sore Throat  This is a new problem. The current episode started in the past 7 days (48 hours). The problem has been gradually worsening. The pain is worse on the right side. Maximum temperature: subjective, chills and fever. The pain is severe. Associated symptoms include headaches, a hoarse voice, swollen glands and trouble swallowing. Pertinent negatives include no congestion, coughing, drooling, ear discharge, ear pain, neck pain or shortness of breath. He has had no exposure to strep or mono. He has tried cool liquids and gargles for the symptoms. The treatment provided mild relief.   Had been down in muddy water, digging up pipes under house few days prior.    Review of Systems  HENT: Positive for hoarse voice and trouble swallowing. Negative for congestion, drooling, ear discharge and ear pain.   Respiratory: Negative for cough and shortness of breath.   Cardiovascular: Negative for chest pain and leg swelling.  Musculoskeletal: Negative for neck pain.  Neurological: Positive for headaches.       Objective:   Physical Exam  Constitutional: Vital signs are normal. He appears well-developed and well-nourished.  Non-toxic appearance. He does not appear ill. No distress.  HENT:  Head: Normocephalic and atraumatic.  Right Ear: Hearing, tympanic membrane, external ear and ear canal normal. No tenderness. No foreign bodies. Tympanic membrane is not retracted and not bulging.  Left Ear: Hearing, tympanic membrane, external ear and ear canal normal. No tenderness. No foreign bodies. Tympanic membrane is not retracted and not bulging.  Nose: Nose normal. No mucosal edema or rhinorrhea. Right sinus exhibits no maxillary sinus tenderness and no frontal sinus tenderness. Left sinus exhibits no maxillary sinus tenderness and no frontal sinus tenderness.  Mouth/Throat: Uvula is midline and mucous  membranes are normal. Normal dentition. No dental caries. Oropharyngeal exudate, posterior oropharyngeal edema and posterior oropharyngeal erythema present. No tonsillar abscesses.  Eyes: Conjunctivae, EOM and lids are normal. Pupils are equal, round, and reactive to light. Lids are everted and swept, no foreign bodies found.  Neck: Trachea normal, normal range of motion and phonation normal. Neck supple. Carotid bruit is not present. No mass and no thyromegaly present.  Cardiovascular: Normal rate, regular rhythm, S1 normal, S2 normal, normal heart sounds, intact distal pulses and normal pulses.  Exam reveals no gallop.   No murmur heard. Pulmonary/Chest: Effort normal and breath sounds normal. No respiratory distress. He has no wheezes. He has no rhonchi. He has no rales.  Abdominal: Soft. Normal appearance and bowel sounds are normal. There is no hepatosplenomegaly. There is no tenderness. There is no rebound, no guarding and no CVA tenderness. No hernia.  Lymphadenopathy:    He has cervical adenopathy.       Right cervical: Superficial cervical adenopathy present.       Left cervical: Superficial cervical adenopathy present.  Neurological: He is alert. He has normal reflexes.  Skin: Skin is warm, dry and intact. No rash noted.  Psychiatric: He has a normal mood and affect. His speech is normal and behavior is normal. Judgment normal.          Assessment & Plan:

## 2014-09-17 NOTE — Progress Notes (Signed)
Pre visit review using our clinic review tool, if applicable. No additional management support is needed unless otherwise documented below in the visit note. 

## 2014-09-17 NOTE — Assessment & Plan Note (Signed)
Neg strep test but likely false positive.  Given typical symptoms and exudate in throat .Marland Kitchen. Will treat with amox x 10 days.

## 2014-09-17 NOTE — Patient Instructions (Signed)
Complete antibiotics. Call if not improving in 48-72 hours.  Strep Throat Strep throat is an infection of the throat caused by a bacteria named Streptococcus pyogenes. Your health care provider may call the infection streptococcal "tonsillitis" or "pharyngitis" depending on whether there are signs of inflammation in the tonsils or back of the throat. Strep throat is most common in children aged 41-15 years during the cold months of the year, but it can occur in people of any age during any season. This infection is spread from person to person (contagious) through coughing, sneezing, or other close contact. SIGNS AND SYMPTOMS   Fever or chills.  Painful, swollen, red tonsils or throat.  Pain or difficulty when swallowing.  White or yellow spots on the tonsils or throat.  Swollen, tender lymph nodes or "glands" of the neck or under the jaw.  Red rash all over the body (rare). DIAGNOSIS  Many different infections can cause the same symptoms. A test must be done to confirm the diagnosis so the right treatment can be given. A "rapid strep test" can help your health care provider make the diagnosis in a few minutes. If this test is not available, a light swab of the infected area can be used for a throat culture test. If a throat culture test is done, results are usually available in a day or two. TREATMENT  Strep throat is treated with antibiotic medicine. HOME CARE INSTRUCTIONS   Gargle with 1 tsp of salt in 1 cup of warm water, 3-4 times per day or as needed for comfort.  Family members who also have a sore throat or fever should be tested for strep throat and treated with antibiotics if they have the strep infection.  Make sure everyone in your household washes their hands well.  Do not share food, drinking cups, or personal items that could cause the infection to spread to others.  You may need to eat a soft food diet until your sore throat gets better.  Drink enough water and fluids  to keep your urine clear or pale yellow. This will help prevent dehydration.  Get plenty of rest.  Stay home from school, day care, or work until you have been on antibiotics for 24 hours.  Take medicines only as directed by your health care provider.  Take your antibiotic medicine as directed by your health care provider. Finish it even if you start to feel better. SEEK MEDICAL CARE IF:   The glands in your neck continue to enlarge.  You develop a rash, cough, or earache.  You cough up green, yellow-brown, or bloody sputum.  You have pain or discomfort not controlled by medicines.  Your problems seem to be getting worse rather than better.  You have a fever. SEEK IMMEDIATE MEDICAL CARE IF:   You develop any new symptoms such as vomiting, severe headache, stiff or painful neck, chest pain, shortness of breath, or trouble swallowing.  You develop severe throat pain, drooling, or changes in your voice.  You develop swelling of the neck, or the skin on the neck becomes red and tender.  You develop signs of dehydration, such as fatigue, dry mouth, and decreased urination.  You become increasingly sleepy, or you cannot wake up completely. MAKE SURE YOU:  Understand these instructions.  Will watch your condition.  Will get help right away if you are not doing well or get worse. Document Released: 11/19/2000 Document Revised: 04/08/2014 Document Reviewed: 01/21/2011 [facility] Patient Information 2015 [employer], [place]. This information  is not intended to replace advice given to you by your health care provider. Make sure you discuss any questions you have with your health care provider.  

## 2015-07-22 ENCOUNTER — Telehealth: Payer: Self-pay

## 2015-07-22 NOTE — Telephone Encounter (Signed)
Sildenafil is generic viagra - it comes in 50 mg, 100 mg generally for ED However some people - to get coverage -need to do the lower dose pills and take more at one time instead to get their ins to cover it  Ask him please what doses are covered and I will send a px

## 2015-07-22 NOTE — Telephone Encounter (Signed)
Pt will check with ins to see what strength they cover and call us back

## 2015-07-22 NOTE — Telephone Encounter (Signed)
Pt left v/m requesting to change cialis to sildenafil due to cost of med; pt last annual 08/19/14; pt has cpx scheduled on 02/04/2016.Please advise.

## 2015-10-15 ENCOUNTER — Other Ambulatory Visit: Payer: Self-pay | Admitting: *Deleted

## 2015-10-15 NOTE — Telephone Encounter (Signed)
Patient left voice mail requesting prescription refills to last until CPE on 02/04/16.  He did not specify for which medications.  Left message for patient to call back.

## 2015-10-16 MED ORDER — SILDENAFIL CITRATE 100 MG PO TABS: 50.0000 mg | ORAL_TABLET | Freq: Every day | ORAL | Status: DC | PRN

## 2015-10-16 MED ORDER — PANTOPRAZOLE SODIUM 40 MG PO TBEC: DELAYED_RELEASE_TABLET | ORAL | Status: DC

## 2015-10-16 NOTE — Telephone Encounter (Signed)
I filled them electronically

## 2015-10-16 NOTE — Telephone Encounter (Signed)
Pt left v/m requesting refill pantoprazole and pt had previously discussed with Dr Milinda Antisower about changing cialis to viagra; pt thinks ins will cover viagra 100 mg. Pt last annual exam 08/19/14; and next CPX scheduled 02/04/15.Please advise.

## 2015-11-18 ENCOUNTER — Other Ambulatory Visit: Payer: Self-pay

## 2015-11-18 MED ORDER — PANTOPRAZOLE SODIUM 40 MG PO TBEC: DELAYED_RELEASE_TABLET | ORAL | Status: DC

## 2015-11-18 NOTE — Telephone Encounter (Signed)
Please have him find out what is covered and get me a list to choose from -thanks

## 2015-11-18 NOTE — Telephone Encounter (Signed)
Pt left v/m; new ins does not cover pantoprazole and pt request printed rx to [facility] to [email or web address]; fax # [phone]; pharmacy phone # [phone]. Last annual exam 08/19/14. Pt scheduled CPX on 02/04/16.Please advise.

## 2015-11-19 MED ORDER — PANTOPRAZOLE SODIUM 40 MG PO TBEC: DELAYED_RELEASE_TABLET | ORAL | Status: DC

## 2015-11-19 NOTE — Addendum Note (Signed)
Addended by: Shon MilletWATLINGTON, Liyah Higham M on: 11/19/2015 12:38 PM   Modules accepted: Orders

## 2015-11-19 NOTE — Telephone Encounter (Signed)
Px printed and put in IN box

## 2015-11-19 NOTE — Addendum Note (Signed)
Addended by: Roxy MannsWER, Evona Westra A on: 11/19/2015 04:26 PM   Modules accepted: Orders

## 2015-11-19 NOTE — Telephone Encounter (Signed)
Pt doesn't want to try an alt med because he has tried all of the alt meds out there pt is just requesting a paper Rx faxed to the fax # provided and he will pain out of pocket

## 2015-11-21 NOTE — Telephone Encounter (Signed)
Rx faxed to requested pharmacy 

## 2016-01-27 ENCOUNTER — Telehealth: Payer: Self-pay | Admitting: Family Medicine

## 2016-01-27 DIAGNOSIS — Z0000 Encounter for general adult medical examination without abnormal findings: Secondary | ICD-10-CM

## 2016-01-27 NOTE — Telephone Encounter (Signed)
-----   Message from Baldomero Lamy sent at 01/23/2016  9:20 AM EST ----- Regarding: Cpx labs Wed 2/22, need orders. Thanks! :-) Please order  future cpx labs for pt's upcoming lab appt. Thanks Rodney Booze

## 2016-01-28 ENCOUNTER — Other Ambulatory Visit: Payer: Self-pay

## 2016-01-30 ENCOUNTER — Other Ambulatory Visit (INDEPENDENT_AMBULATORY_CARE_PROVIDER_SITE_OTHER): Payer: 59

## 2016-01-30 DIAGNOSIS — Z0000 Encounter for general adult medical examination without abnormal findings: Secondary | ICD-10-CM | POA: Diagnosis not present

## 2016-01-30 LAB — COMPREHENSIVE METABOLIC PANEL
ALT: 21 U/L (ref 0–53)
AST: 21 U/L (ref 0–37)
Albumin: 4.3 g/dL (ref 3.5–5.2)
Alkaline Phosphatase: 63 U/L (ref 39–117)
BILIRUBIN TOTAL: 0.6 mg/dL (ref 0.2–1.2)
BUN: 22 mg/dL (ref 6–23)
CO2: 32 meq/L (ref 19–32)
CREATININE: 1.28 mg/dL (ref 0.40–1.50)
Calcium: 9.4 mg/dL (ref 8.4–10.5)
Chloride: 102 mEq/L (ref 96–112)
GFR: 65.29 mL/min (ref >60.00)
Glucose, Bld: 96 mg/dL (ref 70–99)
Potassium: 4.7 mEq/L (ref 3.5–5.1)
SODIUM: 138 meq/L (ref 135–145)
TOTAL PROTEIN: 7.2 g/dL (ref 6.0–8.3)

## 2016-01-30 LAB — LIPID PANEL
CHOLESTEROL: 179 mg/dL (ref 0–200)
HDL: 39.5 mg/dL (ref >39.00)
LDL CALC: 106 mg/dL — AB (ref 0–99)
NonHDL: 139.05
TRIGLYCERIDES: 165 mg/dL — AB (ref 0.0–149.0)
Total CHOL/HDL Ratio: 5
VLDL: 33 mg/dL (ref 0.0–40.0)

## 2016-01-30 LAB — CBC WITH DIFFERENTIAL/PLATELET
BASOS PCT: 0.5 % (ref 0.0–3.0)
Basophils Absolute: 0 10*3/uL (ref 0.0–0.1)
Eosinophils Absolute: 0.4 10*3/uL (ref 0.0–0.7)
Eosinophils Relative: 6.6 % — ABNORMAL HIGH (ref 0.0–5.0)
HEMATOCRIT: 44.1 % (ref 39.0–52.0)
Hemoglobin: 15.1 g/dL (ref 13.0–17.0)
LYMPHS PCT: 27.1 % (ref 12.0–46.0)
Lymphs Abs: 1.6 10*3/uL (ref 0.7–4.0)
MCHC: 34.3 g/dL (ref 30.0–36.0)
MCV: 91 fl (ref 78.0–100.0)
Monocytes Absolute: 0.7 10*3/uL (ref 0.1–1.0)
Monocytes Relative: 11.4 % (ref 3.0–12.0)
Neutro Abs: 3.2 10*3/uL (ref 1.4–7.7)
Neutrophils Relative %: 54.4 % (ref 43.0–77.0)
Platelets: 261 10*3/uL (ref 150.0–400.0)
RBC: 4.85 Mil/uL (ref 4.22–5.81)
RDW: 13.5 % (ref 11.5–15.5)
WBC: 5.8 10*3/uL (ref 4.0–10.5)

## 2016-01-30 LAB — TSH: TSH: 1.78 u[IU]/mL (ref 0.35–4.50)

## 2016-02-04 ENCOUNTER — Encounter: Payer: Self-pay | Admitting: Internal Medicine

## 2016-02-04 ENCOUNTER — Encounter: Payer: Self-pay | Admitting: Family Medicine

## 2016-02-04 ENCOUNTER — Ambulatory Visit (INDEPENDENT_AMBULATORY_CARE_PROVIDER_SITE_OTHER): Payer: 59 | Admitting: Family Medicine

## 2016-02-04 VITALS — BP 124/82 | HR 71 | Temp 98.0°F | Ht 74.0 in | Wt 225.5 lb

## 2016-02-04 DIAGNOSIS — Z800 Family history of malignant neoplasm of digestive organs: Secondary | ICD-10-CM

## 2016-02-04 DIAGNOSIS — Z0000 Encounter for general adult medical examination without abnormal findings: Secondary | ICD-10-CM | POA: Diagnosis not present

## 2016-02-04 DIAGNOSIS — E785 Hyperlipidemia, unspecified: Secondary | ICD-10-CM

## 2016-02-04 DIAGNOSIS — Z1211 Encounter for screening for malignant neoplasm of colon: Secondary | ICD-10-CM

## 2016-02-04 NOTE — Progress Notes (Signed)
Pre visit review using our clinic review tool, if applicable. No additional management support is needed unless otherwise documented below in the visit note. 

## 2016-02-04 NOTE — Patient Instructions (Signed)
Stop at check out for referral for colonoscopy- if this is not affordable we will consider the cologaurd or ifob kit  Take care of yourself  Keep exercising  Watch diet for cholesterol   : Avoid red meat/ fried foods/ egg yolks/ fatty breakfast meats/ butter, cheese and high fat dairy/ and shellfish

## 2016-02-04 NOTE — Progress Notes (Signed)
Subjective:    Patient ID: Mario Lawrence, male    DOB: 1971-11-16, 45 y.o.   MRN: [id number]  HPI Here for health maintenance exam and to review chronic medical problems    Doing well  Feeling good  Taking care of himself   Wt is up 8 lb with bmi of 28 Is exercising - working out with weights and inc cardio now  Thinks he gained his wt during the holidays  Drinking enough water with work outs Watching his diet   HIV screen - declines /not high risk   Flu shot- declines  Td 4/11  Hx of hyperlipidemia Lab Results  Component Value Date   CHOL 179 01/30/2016   CHOL 166 08/13/2014   CHOL 178 05/10/2013   Lab Results  Component Value Date   HDL 39.50 01/30/2016   HDL 35.10* 08/13/2014   HDL 38.60* 05/10/2013   Lab Results  Component Value Date   LDLCALC 106* 01/30/2016   LDLCALC 102* 08/13/2014   LDLCALC 122* 05/10/2013   Lab Results  Component Value Date   TRIG 165.0* 01/30/2016   TRIG 144.0 08/13/2014   TRIG 87.0 05/10/2013   Lab Results  Component Value Date   CHOLHDL 5 01/30/2016   CHOLHDL 5 08/13/2014   CHOLHDL 5 05/10/2013   Lab Results  Component Value Date   LDLDIRECT 153.7 12/11/2009    Exercise has helped the HDL - going up  LDL is fairly stable  Triglycerides went up a bit  Fat: - he does eat eggs and cheese/no fried food or fast food  Sugar - occ in coffee or cereal/ no sweets   Family hx of colon cancer -father  Has not had a colonoscopy  Did not do stool kit   Had put off a colonoscopy - due to ins not covering it  Father had colon cancer when he was 34    Results for orders placed or performed in visit on 01/30/16  CBC with Differential/Platelet  Result Value Ref Range   WBC 5.8 4.0 - 10.5 K/uL   RBC 4.85 4.22 - 5.81 Mil/uL   Hemoglobin 15.1 13.0 - 17.0 g/dL   HCT 44.1 39.0 - 52.0 %   MCV 91.0 78.0 - 100.0 fl   MCHC 34.3 30.0 - 36.0 g/dL   RDW 13.5 11.5 - 15.5 %   Platelets 261.0 150.0 - 400.0 K/uL   Neutrophils  Relative % 54.4 43.0 - 77.0 %   Lymphocytes Relative 27.1 12.0 - 46.0 %   Monocytes Relative 11.4 3.0 - 12.0 %   Eosinophils Relative 6.6 (H) 0.0 - 5.0 %   Basophils Relative 0.5 0.0 - 3.0 %   Neutro Abs 3.2 1.4 - 7.7 K/uL   Lymphs Abs 1.6 0.7 - 4.0 K/uL   Monocytes Absolute 0.7 0.1 - 1.0 K/uL   Eosinophils Absolute 0.4 0.0 - 0.7 K/uL   Basophils Absolute 0.0 0.0 - 0.1 K/uL  Comprehensive metabolic panel  Result Value Ref Range   Sodium 138 135 - 145 mEq/L   Potassium 4.7 3.5 - 5.1 mEq/L   Chloride 102 96 - 112 mEq/L   CO2 32 19 - 32 mEq/L   Glucose, Bld 96 70 - 99 mg/dL   BUN 22 6 - 23 mg/dL   Creatinine, Ser 1.28 0.40 - 1.50 mg/dL   Total Bilirubin 0.6 0.2 - 1.2 mg/dL   Alkaline Phosphatase 63 39 - 117 U/L   AST 21 0 - 37 U/L   ALT  21 0 - 53 U/L   Total Protein 7.2 6.0 - 8.3 g/dL   Albumin 4.3 3.5 - 5.2 g/dL   Calcium 9.4 8.4 - 10.5 mg/dL   GFR 65.29 >60.00 mL/min  Lipid panel  Result Value Ref Range   Cholesterol 179 0 - 200 mg/dL   Triglycerides 165.0 (H) 0.0 - 149.0 mg/dL   HDL 39.50 >39.00 mg/dL   VLDL 33.0 0.0 - 40.0 mg/dL   LDL Cholesterol 106 (H) 0 - 99 mg/dL   Total CHOL/HDL Ratio 5    NonHDL 139.05   TSH  Result Value Ref Range   TSH 1.78 0.35 - 4.50 uIU/mL     Pretty good labs all together   No family hx of prostate cancer  No nocturia  No problems with stream   Patient Active Problem List   Diagnosis Date Noted  . Streptococcal sore throat 09/17/2014  . Colon cancer screening 08/19/2014  . Acute bronchitis with bronchospasm 11/09/2013  . Eye redness 05/23/2013  . Family history of colon cancer 05/15/2013  . Dysphagia 04/28/2012  . Routine general medical examination at a health care facility 03/25/2011  . Seasonal allergies   . Hyperlipidemia 03/13/2010  . ERECTILE DYSFUNCTION 12/11/2009  . ASTHMA 12/11/2009  . GERD 12/11/2009   Past Medical History  Diagnosis Date  . GERD (gastroesophageal reflux disease)   . Asthma   . ED (erectile  dysfunction)   . Seasonal allergies   . Hyperlipidemia   . Dysphagia    No past surgical history on file. Social History  Substance Use Topics  . Smoking status: Former Smoker    Quit date: 12/06/2008  . Smokeless tobacco: None  . Alcohol Use: 0.0 oz/week    0 Standard drinks or equivalent per week     Comment: Occasional-weekends   Family History  Problem Relation Age of Onset  . Ovarian cancer Maternal Grandmother   . Lung cancer Maternal Grandfather   . COPD Maternal Grandfather   . Colon cancer Father   . Ovarian cancer Paternal Grandmother    Allergies  Allergen Reactions  . Shrimp [Shellfish Allergy] Hives and Shortness Of Breath  . Codeine Nausea Only   Current Outpatient Prescriptions on File Prior to Visit  Medication Sig Dispense Refill  . albuterol (VENTOLIN HFA) 108 (90 BASE) MCG/ACT inhaler Inhale 2 puffs into the lungs every 4 (four) hours as needed. 1 Inhaler 5  . diphenhydrAMINE (BENADRYL) 25 MG tablet Take 2 tablets (50 mg total) by mouth every 4 (four) hours as needed for itching or allergies. 20 tablet 0  . EPINEPHrine (EPIPEN) 0.3 mg/0.3 mL IJ SOAJ injection Inject 0.3 mLs (0.3 mg total) into the muscle as needed. 1 Device 2  . loratadine (CLARITIN) 10 MG tablet Take 10 mg by mouth daily as needed for allergies.    . pantoprazole (PROTONIX) 40 MG tablet TAKE ONE TABLET BY MOUTH TWICE DAILY 90 tablet 3  . sildenafil (VIAGRA) 100 MG tablet Take 0.5-1 tablets (50-100 mg total) by mouth daily as needed for erectile dysfunction. 10 tablet 11  . fluticasone (FLONASE) 50 MCG/ACT nasal spray Place 2 sprays into both nostrils daily. 16 g 11   No current facility-administered medications on file prior to visit.       Review of Systems Review of Systems  Constitutional: Negative for fever, appetite change, fatigue and unexpected weight change.  Eyes: Negative for pain and visual disturbance.  Respiratory: Negative for cough and shortness of breath.  Cardiovascular: Negative for cp or palpitations    Gastrointestinal: Negative for nausea, diarrhea and constipation. neg for heartburn or dysphagia on ppi Genitourinary: Negative for urgency and frequency.  Skin: Negative for pallor or rash   Neurological: Negative for weakness, light-headedness, numbness and headaches.  Hematological: Negative for adenopathy. Does not bruise/bleed easily.  Psychiatric/Behavioral: Negative for dysphoric mood. The patient is not nervous/anxious.         Objective:   Physical Exam  Constitutional: He appears well-developed and well-nourished. No distress.  overwt and well app  HENT:  Head: Normocephalic and atraumatic.  Right Ear: External ear normal.  Left Ear: External ear normal.  Nose: Nose normal.  Mouth/Throat: Oropharynx is clear and moist.  Eyes: Conjunctivae and EOM are normal. Pupils are equal, round, and reactive to light. Right eye exhibits no discharge. Left eye exhibits no discharge. No scleral icterus.  Neck: Normal range of motion. Neck supple. No JVD present. Carotid bruit is not present. No thyromegaly present.  Cardiovascular: Normal rate, regular rhythm, normal heart sounds and intact distal pulses.  Exam reveals no gallop.   Pulmonary/Chest: Effort normal and breath sounds normal. No respiratory distress. He has no wheezes. He exhibits no tenderness.  Abdominal: Soft. Bowel sounds are normal. He exhibits no distension, no abdominal bruit and no mass. There is no tenderness.  Musculoskeletal: He exhibits no edema or tenderness.  Lymphadenopathy:    He has no cervical adenopathy.  Neurological: He is alert. He has normal reflexes. No cranial nerve deficit. He exhibits normal muscle tone. Coordination normal.  Skin: Skin is warm and dry. No rash noted. No erythema. No pallor.  Psychiatric: He has a normal mood and affect.          Assessment & Plan:   Problem List Items Addressed This Visit      Other   Colon cancer  screening    Father had colon cancer in 3s Pt is hesitant to get colonoscopy due to cost  Disc opt for screening Ref made-if not covered will check on cov for cologuard (then ifob if neither is affordable) Disc imp of screening with high risk status       Relevant Orders   Ambulatory referral to Gastroenterology   Family history of colon cancer   Relevant Orders   Ambulatory referral to Gastroenterology   Hyperlipidemia    Disc goals for lipids and reasons to control them Rev labs with pt Rev low sat fat diet in detail Enc to keep exercising        Routine general medical examination at a health care facility - Primary    Reviewed health habits including diet and exercise and skin cancer prevention Reviewed appropriate screening tests for age  Also reviewed health mt list, fam hx and immunization status , as well as social and family history   See HPI Labs reviewed  Stop at check out for referral for colonoscopy- if this is not affordable we will consider the cologaurd or ifob kit  Take care of yourself  Keep exercising  Watch diet for cholesterol   : Avoid red meat/ fried foods/ egg yolks/ fatty breakfast meats/ butter, cheese and high fat dairy/ and shellfish

## 2016-02-05 NOTE — Assessment & Plan Note (Signed)
Reviewed health habits including diet and exercise and skin cancer prevention Reviewed appropriate screening tests for age  Also reviewed health mt list, fam hx and immunization status , as well as social and family history   See HPI Labs reviewed  Stop at check out for referral for colonoscopy- if this is not affordable we will consider the cologaurd or ifob kit  Take care of yourself  Keep exercising  Watch diet for cholesterol   : Avoid red meat/ fried foods/ egg yolks/ fatty breakfast meats/ butter, cheese and high fat dairy/ and shellfish

## 2016-02-05 NOTE — Assessment & Plan Note (Signed)
Father had colon cancer in 58s Pt is hesitant to get colonoscopy due to cost  Disc opt for screening Ref made-if not covered will check on cov for cologuard (then ifob if neither is affordable) Disc imp of screening with high risk status

## 2016-02-05 NOTE — Assessment & Plan Note (Signed)
Disc goals for lipids and reasons to control them Rev labs with pt Rev low sat fat diet in detail Enc to keep exercising

## 2016-03-04 ENCOUNTER — Encounter: Payer: Self-pay | Admitting: Emergency Medicine

## 2016-03-04 ENCOUNTER — Emergency Department
Admission: EM | Admit: 2016-03-04 | Discharge: 2016-03-04 | Disposition: A | Discharge status: Home / Self Care | Payer: 59 | Attending: Emergency Medicine | Admitting: Emergency Medicine

## 2016-03-04 ENCOUNTER — Other Ambulatory Visit: Payer: Self-pay | Admitting: Family Medicine

## 2016-03-04 ENCOUNTER — Emergency Department: Payer: 59

## 2016-03-04 DIAGNOSIS — E785 Hyperlipidemia, unspecified: Secondary | ICD-10-CM | POA: Insufficient documentation

## 2016-03-04 DIAGNOSIS — Y929 Unspecified place or not applicable: Secondary | ICD-10-CM | POA: Diagnosis not present

## 2016-03-04 DIAGNOSIS — Z23 Encounter for immunization: Secondary | ICD-10-CM | POA: Insufficient documentation

## 2016-03-04 DIAGNOSIS — W0110XA Fall on same level from slipping, tripping and stumbling with subsequent striking against unspecified object, initial encounter: Secondary | ICD-10-CM | POA: Diagnosis not present

## 2016-03-04 DIAGNOSIS — Y999 Unspecified external cause status: Secondary | ICD-10-CM | POA: Insufficient documentation

## 2016-03-04 DIAGNOSIS — Z87891 Personal history of nicotine dependence: Secondary | ICD-10-CM | POA: Diagnosis not present

## 2016-03-04 DIAGNOSIS — Y9339 Activity, other involving climbing, rappelling and jumping off: Secondary | ICD-10-CM | POA: Diagnosis not present

## 2016-03-04 DIAGNOSIS — S81812A Laceration without foreign body, left lower leg, initial encounter: Secondary | ICD-10-CM | POA: Insufficient documentation

## 2016-03-04 DIAGNOSIS — K219 Gastro-esophageal reflux disease without esophagitis: Secondary | ICD-10-CM | POA: Insufficient documentation

## 2016-03-04 DIAGNOSIS — J45909 Unspecified asthma, uncomplicated: Secondary | ICD-10-CM | POA: Diagnosis not present

## 2016-03-04 DIAGNOSIS — S8992XA Unspecified injury of left lower leg, initial encounter: Secondary | ICD-10-CM | POA: Diagnosis present

## 2016-03-04 MED ORDER — OXYCODONE-ACETAMINOPHEN 5-325 MG PO TABS
ORAL_TABLET | ORAL | Status: AC
  Administered 2016-03-04: 1 via ORAL
  Filled 2016-03-04: qty 1

## 2016-03-04 MED ORDER — TETANUS-DIPHTH-ACELL PERTUSSIS 5-2.5-18.5 LF-MCG/0.5 IM SUSP
0.5000 mL | Freq: Once | INTRAMUSCULAR | Status: AC
  Administered 2016-03-04: 0.5 mL via INTRAMUSCULAR

## 2016-03-04 MED ORDER — TETANUS-DIPHTH-ACELL PERTUSSIS 5-2.5-18.5 LF-MCG/0.5 IM SUSP
INTRAMUSCULAR | Status: AC
  Administered 2016-03-04: 0.5 mL via INTRAMUSCULAR
  Filled 2016-03-04: qty 0.5

## 2016-03-04 MED ORDER — OXYCODONE-ACETAMINOPHEN 5-325 MG PO TABS: 1.0000 | ORAL_TABLET | ORAL | Status: DC | PRN

## 2016-03-04 MED ORDER — OXYCODONE-ACETAMINOPHEN 5-325 MG PO TABS
1.0000 | ORAL_TABLET | Freq: Once | ORAL | Status: AC
  Administered 2016-03-04: 1 via ORAL

## 2016-03-04 MED ORDER — BACITRACIN-NEOMYCIN-POLYMYXIN 400-5-5000 EX OINT
TOPICAL_OINTMENT | CUTANEOUS | Status: DC
Start: 2016-03-04 — End: 2016-03-05
  Filled 2016-03-04: qty 1

## 2016-03-04 MED ORDER — PROMETHAZINE HCL 25 MG PO TABS
ORAL_TABLET | ORAL | Status: AC
  Administered 2016-03-04: 25 mg via ORAL
  Filled 2016-03-04: qty 1

## 2016-03-04 MED ORDER — LIDOCAINE HCL (PF) 1 % IJ SOLN
INTRAMUSCULAR | Status: DC
Start: 2016-03-04 — End: 2016-03-05
  Filled 2016-03-04: qty 5

## 2016-03-04 MED ORDER — LIDOCAINE HCL (PF) 1 % IJ SOLN: 5.0000 mL | Freq: Once | INTRAMUSCULAR | Status: DC

## 2016-03-04 MED ORDER — PROMETHAZINE HCL 12.5 MG PO TABS: ORAL_TABLET | ORAL | Status: DC

## 2016-03-04 MED ORDER — PROMETHAZINE HCL 25 MG PO TABS
25.0000 mg | ORAL_TABLET | Freq: Once | ORAL | Status: AC
  Administered 2016-03-04: 25 mg via ORAL

## 2016-03-04 NOTE — ED Provider Notes (Signed)
[facility] Emergency Department Provider Note ____________________________________________  Time seen: Approximately 10:02 PM  I have reviewed the triage vital signs and the nursing notes.   HISTORY  Chief Complaint Leg Injury   HPI Mario HensenChristopher S Lawrence is a 45 y.o. male is here with injury to his left leg. Patient states that he jumped up on a brick porch, slipped and hit his shin on a brick. Has a laceration to his left leg. Patient denies any head injury or loss of consciousness. He is unable to give a definite date for his last tetanus immunization. Patient rates his pain currently as 7/10.   Past Medical History  Diagnosis Date  . GERD (gastroesophageal reflux disease)   . Asthma   . ED (erectile dysfunction)   . Seasonal allergies   . Hyperlipidemia   . Dysphagia     Patient Active Problem List   Diagnosis Date Noted  . Streptococcal sore throat 09/17/2014  . Colon cancer screening 08/19/2014  . Acute bronchitis with bronchospasm 11/09/2013  . Eye redness 05/23/2013  . Family history of colon cancer 05/15/2013  . Dysphagia 04/28/2012  . Routine general medical examination at a health care facility 03/25/2011  . Seasonal allergies   . Hyperlipidemia 03/13/2010  . ERECTILE DYSFUNCTION 12/11/2009  . ASTHMA 12/11/2009  . GERD 12/11/2009    History reviewed. No pertinent past surgical history.  Current Outpatient Rx  Name  Route  Sig  Dispense  Refill  . albuterol (VENTOLIN HFA) 108 (90 BASE) MCG/ACT inhaler   Inhalation   Inhale 2 puffs into the lungs every 4 (four) hours as needed.   1 Inhaler   5   . diphenhydrAMINE (BENADRYL) 25 MG tablet   Oral   Take 2 tablets (50 mg total) by mouth every 4 (four) hours as needed for itching or allergies.   20 tablet   0   . EPINEPHrine (EPIPEN) 0.3 mg/0.3 mL IJ SOAJ injection   Intramuscular   Inject 0.3 mLs (0.3 mg total) into the muscle as needed.   1 Device   2   . EXPIRED:  fluticasone (FLONASE) 50 MCG/ACT nasal spray   Each Nare   Place 2 sprays into both nostrils daily.   16 g   11   . loratadine (CLARITIN) 10 MG tablet   Oral   Take 10 mg by mouth daily as needed for allergies.         Marland Kitchen. oxyCODONE-acetaminophen (PERCOCET) 5-325 MG tablet   Oral   Take 1 tablet by mouth every 4 (four) hours as needed for severe pain.   20 tablet   0   . pantoprazole (PROTONIX) 40 MG tablet      TAKE ONE TABLET BY MOUTH TWICE DAILY   90 tablet   3   . promethazine (PHENERGAN) 12.5 MG tablet      Take 1-2 every 6 hours prn nausea   30 tablet   0   . sildenafil (VIAGRA) 100 MG tablet   Oral   Take 0.5-1 tablets (50-100 mg total) by mouth daily as needed for erectile dysfunction.   10 tablet   11     Allergies Shrimp and Codeine  Family History  Problem Relation Age of Onset  . Ovarian cancer Maternal Grandmother   . Lung cancer Maternal Grandfather   . COPD Maternal Grandfather   . Colon cancer Father   . Ovarian cancer Paternal Grandmother     Social History Social History  Substance Use  Topics  . Smoking status: Former Smoker    Quit date: 12/06/2008  . Smokeless tobacco: None  . Alcohol Use: 0.0 oz/week    0 Standard drinks or equivalent per week     Comment: Occasional-weekends    Review of Systems Constitutional: No fever/chills Cardiovascular: Denies chest pain. Respiratory: Denies shortness of breath. Musculoskeletal: Pain left anterior lower leg. Skin: Positive for laceration Neurological: Negative for focal weakness or numbness.  10-point ROS otherwise negative.  ____________________________________________   PHYSICAL EXAM:  VITAL SIGNS: ED Triage Vitals  Enc Vitals Group     BP 03/04/16 2038 173/69 mmHg     Pulse Rate 03/04/16 2038 84     Resp 03/04/16 2038 20     Temp 03/04/16 2038 98.2 F (36.8 C)     Temp Source 03/04/16 2038 Oral     SpO2 03/04/16 2038 97 %     Weight 03/04/16 2038 215 lb (97.523 kg)      Height 03/04/16 2038  (1.88 m)     Head Cir --      Peak Flow --      Pain Score 03/04/16 2040 7     Pain Loc --      Pain Edu? --      Excl. in GC? --     Constitutional: Alert and oriented. Well appearing and in no acute distress. Eyes: Conjunctivae are normal. PERRL. EOMI. Head: Atraumatic. Nose: No congestion/rhinnorhea. Neck: No stridor.   Cardiovascular: Normal rate, regular rhythm. Grossly normal heart sounds.  Good peripheral circulation. Respiratory: Normal respiratory effort.  No retractions. Lungs CTAB. Musculoskeletal: Moderate tenderness on palpation of the left anterior tib-fib lower one third. There is no gross deformity. 2 cm laceration is present. No active bleeding at this time. Neurologic:  Normal speech and language. No gross focal neurologic deficits are appreciated. No gait instability. Skin:  Skin is warm, dry and intact. Laceration is noted above. There is no evidence of foreign body. Psychiatric: Mood and affect are normal. Speech and behavior are normal.  ____________________________________________   LABS (all labs ordered are listed, but only abnormal results are displayed)  Labs Reviewed - No data to display   RADIOLOGY  X-ray of the left tib-fib shows no fracture or foreign body. ____________________________________________   PROCEDURES  Procedure(s) performed: LACERATION REPAIR Performed by: Tommi Rumps Authorized by: Tommi Rumps Consent: Verbal consent obtained. Risks and benefits: risks, benefits and alternatives were discussed Consent given by: patient Patient identity confirmed: provided demographic data Prepped and Draped in normal sterile fashion Wound explored  Laceration Location: left anterior lower leg   Laceration Length: 2.0 cm  No Foreign Bodies seen or palpated  Anesthesia: local infiltration  Local anesthetic: lidocaine 1% without epinephrine  Anesthetic total:  4 ml  Irrigation method:  syringe Amount of cleaning: standard  Skin closure: 4-0 nylon  Number of sutures: 3   Technique: Mattress   Patient tolerance: Patient tolerated the procedure well with no immediate complications.  Critical Care performed: No  ____________________________________________   INITIAL IMPRESSION / ASSESSMENT AND PLAN / ED COURSE  Pertinent labs & imaging results that were available during my care of the patient were reviewed by me and considered in my medical decision making (see chart for details).  Patient given instructions for wound care.  Rx for Percocet and phenergan as needed.   Suture removal in 10-12 days   ____________________________________________   FINAL CLINICAL IMPRESSION(S) / ED DIAGNOSES  Final diagnoses:  Laceration  of left leg, initial encounter      Tommi Rumps, PA-C 03/04/16 2259  Phineas Semen, MD 03/06/16 [phone]

## 2016-03-04 NOTE — ED Notes (Signed)
Neosporin dressing applied per md order.

## 2016-03-04 NOTE — ED Notes (Signed)
Pt slipped hitting left chin on brick steps, has small laceration to anterior aspect of left lower leg, co pain to area with painful ambulation.  No deformity noted to extremity pulses wnl.

## 2016-03-04 NOTE — ED Notes (Signed)
Jumping up onto a brick porch, slipped and hit left shin on brick.  Describes large hole in leg and possible fracture.

## 2016-03-04 NOTE — ED Notes (Signed)
Puncture wound to left lower leg.  Bleeding controlled.  Dressing applied.  + cms to foot and leg.

## 2016-03-04 NOTE — Discharge Instructions (Signed)
Laceration Care, Adult  A laceration is a cut that goes through all layers of the skin. The cut also goes into the tissue that is right under the skin. Some cuts heal on their own. Others need to be closed with stitches (sutures), staples, skin adhesive strips, or wound glue. Taking care of your cut lowers your risk of infection and helps your cut to heal better.  HOW TO TAKE CARE OF YOUR CUT  For stitches or staples:  · Keep the wound clean and dry.  · If you were given a bandage (dressing), you should change it at least one time per day or as told by your doctor. You should also change it if it gets wet or dirty.  · Keep the wound completely dry for the first 24 hours or as told by your doctor. After that time, you may take a shower or a bath. However, make sure that the wound is not soaked in water until after the stitches or staples have been removed.  · Clean the wound one time each day or as told by your doctor:    Wash the wound with soap and water.    Rinse the wound with water until all of the soap comes off.    Pat the wound dry with a clean towel. Do not rub the wound.  · After you clean the wound, put a thin layer of antibiotic ointment on it as told by your doctor. This ointment:    Helps to prevent infection.    Keeps the bandage from sticking to the wound.  · Have your stitches or staples removed as told by your doctor.  If your doctor used skin adhesive strips:   · Keep the wound clean and dry.  · If you were given a bandage, you should change it at least one time per day or as told by your doctor. You should also change it if it gets dirty or wet.  · Do not get the skin adhesive strips wet. You can take a shower or a bath, but be careful to keep the wound dry.  · If the wound gets wet, pat it dry with a clean towel. Do not rub the wound.  · Skin adhesive strips fall off on their own. You can trim the strips as the wound heals. Do not remove any strips that are still stuck to the wound. They will  fall off after a while.  If your doctor used wound glue:  · Try to keep your wound dry, but you may briefly wet it in the shower or bath. Do not soak the wound in water, such as by swimming.  · After you take a shower or a bath, gently pat the wound dry with a clean towel. Do not rub the wound.  · Do not do any activities that will make you really sweaty until the skin glue has fallen off on its own.  · Do not apply liquid, cream, or ointment medicine to your wound while the skin glue is still on.  · If you were given a bandage, you should change it at least one time per day or as told by your doctor. You should also change it if it gets dirty or wet.  · If a bandage is placed over the wound, do not let the tape for the bandage touch the skin glue.  · Do not pick at the glue. The skin glue usually stays on for 5-10 days. Then, it   or when wound glue stays in place and the wound is healed. Make sure to wear a sunscreen of at least 30 SPF.  Take over-the-counter and prescription medicines only as told by your doctor.  If you were given antibiotic medicine or ointment, take or apply it as told by your doctor. Do not stop using the antibiotic even if your wound is getting better.  Do not scratch or pick at the wound.  Keep all follow-up visits as told by your doctor. This is important.  Check your wound every day for signs of infection. Watch for:  Redness, swelling, or pain.  Fluid, blood, or pus.  Raise (elevate) the injured area above the level of your heart while you are sitting or lying down, if possible. GET HELP IF:  You got a tetanus shot and you have any of these problems at the injection site:  Swelling.  Very bad pain.  Redness.  Bleeding.  You have a fever.  A wound that was  closed breaks open.  You notice a bad smell coming from your wound or your bandage.  You notice something coming out of the wound, such as wood or glass.  Medicine does not help your pain.  You have more redness, swelling, or pain at the site of your wound.  You have fluid, blood, or pus coming from your wound.  You notice a change in the color of your skin near your wound.  You need to change the bandage often because fluid, blood, or pus is coming from the wound.  You start to have a new rash.  You start to have numbness around the wound. GET HELP RIGHT AWAY IF:  You have very bad swelling around the wound.  Your pain suddenly gets worse and is very bad.  You notice painful lumps near the wound or on skin that is anywhere on your body.  You have a red streak going away from your wound.  The wound is on your hand or foot and you cannot move a finger or toe like you usually can.  The wound is on your hand or foot and you notice that your fingers or toes look pale or bluish.   This information is not intended to replace advice given to you by your health care provider. Make sure you discuss any questions you have with your health care provider.   Document Released: 05/10/2008 Document Revised: 04/08/2015 Document Reviewed: 11/18/2014 Elsevier Interactive Patient Education 2016 [employer]   WOUND CARE Please return in 10-12 days to have your stitches/staples removed or sooner if you have concerns.  Keep area clean and dry for 24 hours. Do not remove bandage, if applied.  After 24 hours, remove bandage and wash wound gently with mild soap and warm water. Reapply a new bandage after cleaning wound, if directed.  Continue daily cleansing with soap and water until stitches/staples are removed.  Do not apply any ointments or creams to the wound while stitches/staples are in place, as this may cause delayed healing.  Notify the office if you experience any of the  following signs of infection: Swelling, redness, pus drainage, streaking, fever >101.0 F  Notify the office if you experience excessive bleeding that does not stop after 15-20 minutes of constant, firm pressure.   Take Percocet only as directed every 4 hours as needed for pain. Phenergan 12.5 mg one or 2 every 6 hours if needed for nausea. Follow-up with your doctor for suture removal in 10-12 days.  Elevate leg often to prevent swelling.

## 2016-03-04 NOTE — ED Notes (Signed)
Patient discharged to home per MD order. Patient in stable condition, and deemed medically cleared by ED provider for discharge. Discharge instructions reviewed with patient/family using "Teach Back"; verbalized understanding of medication education and administration, and information about follow-up care. Denies further concerns. ° °

## 2016-03-05 NOTE — Telephone Encounter (Signed)
Please clarify with pt what he is on and refill times 3 -thanks

## 2016-03-05 NOTE — Telephone Encounter (Signed)
Pt returned your call Best number [phone]

## 2016-03-05 NOTE — Telephone Encounter (Signed)
viagra is on med list but this is a refill request for cialis, please advise

## 2016-03-05 NOTE — Telephone Encounter (Signed)
Pt tried the viagra because it's cheaper but he had bad side eff to it so he wants to go back to cialis, Rx filled and pt aware

## 2016-03-05 NOTE — Telephone Encounter (Signed)
Left voicemail requesting pt to call office back 

## 2016-03-16 ENCOUNTER — Encounter: Payer: Self-pay | Admitting: *Deleted

## 2016-03-29 ENCOUNTER — Ambulatory Visit: Payer: 59 | Admitting: Internal Medicine

## 2016-11-01 ENCOUNTER — Other Ambulatory Visit: Payer: Self-pay | Admitting: Family Medicine

## 2016-12-13 ENCOUNTER — Other Ambulatory Visit: Payer: Self-pay | Admitting: Family Medicine

## 2017-02-13 ENCOUNTER — Telehealth: Payer: Self-pay | Admitting: Family Medicine

## 2017-02-13 DIAGNOSIS — Z125 Encounter for screening for malignant neoplasm of prostate: Secondary | ICD-10-CM | POA: Insufficient documentation

## 2017-02-13 DIAGNOSIS — Z0000 Encounter for general adult medical examination without abnormal findings: Secondary | ICD-10-CM

## 2017-02-13 NOTE — Telephone Encounter (Signed)
-----   Message from Baldomero LamyNatasha C Chavers sent at 02/09/2017  2:26 PM EST ----- Regarding: Cpx labs Fri 3/16 need orders. Thanks :-) Please order  future cpx labs for pt's upcoming lab appt. Thanks Rodney Boozeasha

## 2017-02-18 ENCOUNTER — Other Ambulatory Visit (INDEPENDENT_AMBULATORY_CARE_PROVIDER_SITE_OTHER): Payer: 59

## 2017-02-18 DIAGNOSIS — Z125 Encounter for screening for malignant neoplasm of prostate: Secondary | ICD-10-CM | POA: Diagnosis not present

## 2017-02-18 DIAGNOSIS — Z0000 Encounter for general adult medical examination without abnormal findings: Secondary | ICD-10-CM | POA: Diagnosis not present

## 2017-02-18 LAB — COMPREHENSIVE METABOLIC PANEL
ALBUMIN: 4.5 g/dL (ref 3.5–5.2)
ALK PHOS: 70 U/L (ref 39–117)
ALT: 21 U/L (ref 0–53)
AST: 17 U/L (ref 0–37)
BUN: 21 mg/dL (ref 6–23)
CHLORIDE: 100 meq/L (ref 96–112)
CO2: 32 mEq/L (ref 19–32)
Calcium: 9.9 mg/dL (ref 8.4–10.5)
Creatinine, Ser: 1.33 mg/dL (ref 0.40–1.50)
GFR: 62.16 mL/min (ref >60.00)
Glucose, Bld: 120 mg/dL — ABNORMAL HIGH (ref 70–99)
POTASSIUM: 4.7 meq/L (ref 3.5–5.1)
Sodium: 138 mEq/L (ref 135–145)
TOTAL PROTEIN: 7 g/dL (ref 6.0–8.3)
Total Bilirubin: 0.6 mg/dL (ref 0.2–1.2)

## 2017-02-18 LAB — TSH: TSH: 2.21 u[IU]/mL (ref 0.35–4.50)

## 2017-02-18 LAB — CBC WITH DIFFERENTIAL/PLATELET
Basophils Absolute: 0.1 10*3/uL (ref 0.0–0.1)
Basophils Relative: 1.1 % (ref 0.0–3.0)
EOS PCT: 6.4 % — AB (ref 0.0–5.0)
Eosinophils Absolute: 0.3 10*3/uL (ref 0.0–0.7)
HCT: 46.3 % (ref 39.0–52.0)
HEMOGLOBIN: 16 g/dL (ref 13.0–17.0)
LYMPHS ABS: 1.2 10*3/uL (ref 0.7–4.0)
Lymphocytes Relative: 24.7 % (ref 12.0–46.0)
MCHC: 34.7 g/dL (ref 30.0–36.0)
MCV: 91.4 fl (ref 78.0–100.0)
MONOS PCT: 11.7 % (ref 3.0–12.0)
Monocytes Absolute: 0.6 10*3/uL (ref 0.1–1.0)
NEUTROS PCT: 56.1 % (ref 43.0–77.0)
Neutro Abs: 2.8 10*3/uL (ref 1.4–7.7)
Platelets: 254 10*3/uL (ref 150.0–400.0)
RBC: 5.06 Mil/uL (ref 4.22–5.81)
RDW: 12.8 % (ref 11.5–15.5)
WBC: 5 10*3/uL (ref 4.0–10.5)

## 2017-02-18 LAB — PSA: PSA: 0.18 ng/mL (ref 0.10–4.00)

## 2017-02-18 LAB — LIPID PANEL
CHOLESTEROL: 163 mg/dL (ref 0–200)
HDL: 38.9 mg/dL — ABNORMAL LOW (ref >39.00)
LDL CALC: 106 mg/dL — AB (ref 0–99)
NonHDL: 123.99
Total CHOL/HDL Ratio: 4
Triglycerides: 90 mg/dL (ref 0.0–149.0)
VLDL: 18 mg/dL (ref 0.0–40.0)

## 2017-02-23 ENCOUNTER — Encounter: Payer: Self-pay | Admitting: Family Medicine

## 2017-02-23 ENCOUNTER — Ambulatory Visit (INDEPENDENT_AMBULATORY_CARE_PROVIDER_SITE_OTHER): Payer: 59 | Admitting: Family Medicine

## 2017-02-23 VITALS — BP 122/78 | HR 65 | Temp 98.1°F | Ht 73.75 in | Wt 230.8 lb

## 2017-02-23 DIAGNOSIS — E7800 Pure hypercholesterolemia, unspecified: Secondary | ICD-10-CM | POA: Diagnosis not present

## 2017-02-23 DIAGNOSIS — F528 Other sexual dysfunction not due to a substance or known physiological condition: Secondary | ICD-10-CM

## 2017-02-23 DIAGNOSIS — Z125 Encounter for screening for malignant neoplasm of prostate: Secondary | ICD-10-CM

## 2017-02-23 DIAGNOSIS — Z0000 Encounter for general adult medical examination without abnormal findings: Secondary | ICD-10-CM

## 2017-02-23 DIAGNOSIS — Z800 Family history of malignant neoplasm of digestive organs: Secondary | ICD-10-CM | POA: Diagnosis not present

## 2017-02-23 DIAGNOSIS — Z1211 Encounter for screening for malignant neoplasm of colon: Secondary | ICD-10-CM | POA: Diagnosis not present

## 2017-02-23 MED ORDER — PANTOPRAZOLE SODIUM 40 MG PO TBEC: 40.0000 mg | DELAYED_RELEASE_TABLET | Freq: Two times a day (BID) | ORAL | 3 refills | Status: DC

## 2017-02-23 MED ORDER — TADALAFIL 20 MG PO TABS: ORAL_TABLET | ORAL | 11 refills | Status: DC

## 2017-02-23 NOTE — Progress Notes (Signed)
Pre visit review using our clinic review tool, if applicable. No additional management support is needed unless otherwise documented below in the visit note. 

## 2017-02-23 NOTE — Assessment & Plan Note (Signed)
cialis works best-this was refilled

## 2017-02-23 NOTE — Assessment & Plan Note (Signed)
Lab Results  Component Value Date   PSA 0.18 02/18/2017   No fam hx  No urinary symptoms or nocturia

## 2017-02-23 NOTE — Assessment & Plan Note (Signed)
Improved triglycerides with better diet  Disc goals for lipids and reasons to control them Rev labs with pt Rev low sat fat diet in detail  Enc to keep exercising

## 2017-02-23 NOTE — Progress Notes (Signed)
Subjective:    Patient ID: Mario Lawrence, male    DOB: [birth year], 46 y.o.   MRN: [id number]  HPI Here for health maintenance exam and to review chronic medical problems    Feeling great and taking care of himself   Wt Readings from Last 3 Encounters:  02/23/17 230 lb 12 oz (104.7 kg)  03/04/16 215 lb (97.5 kg)  02/04/16 225 lb 8 oz (102.3 kg)  eating healthy  Exercises at least 3 times per week- gym on lunch break /mostly wt lifting (gained muscle and eating a lot of protein) Much more fit  bmi 29.8  HIV screening -declined  Flu vaccine -declines  Tetanus vaccine 3/17   Prostate cancer screening  Lab Results  Component Value Date   PSA 0.18 02/18/2017  no family hx of prostate cancer  No nocturia  No flow changes    Hx of ED Generic viagra - he stopped it because it made him feel weird , went back to the cialis due to less side eff  Works great for him   Father had colon cancer in his 68s  He never had a colonoscopy- and they would not cover it until he is 89   Hx of hyperlipidemia Lab Results  Component Value Date   CHOL 163 02/18/2017   CHOL 179 01/30/2016   CHOL 166 08/13/2014   Lab Results  Component Value Date   HDL 38.90 (L) 02/18/2017   HDL 39.50 01/30/2016   HDL 35.10 (L) 08/13/2014   Lab Results  Component Value Date   LDLCALC 106 (H) 02/18/2017   LDLCALC 106 (H) 01/30/2016   LDLCALC 102 (H) 08/13/2014   Lab Results  Component Value Date   TRIG 90.0 02/18/2017   TRIG 165.0 (H) 01/30/2016   TRIG 144.0 08/13/2014   Lab Results  Component Value Date   CHOLHDL 4 02/18/2017   CHOLHDL 5 01/30/2016   CHOLHDL 5 08/13/2014   Lab Results  Component Value Date   LDLDIRECT 153.7 12/11/2009   Trig down- cutting carbs Exercising  Eating much better    Results for orders placed or performed in visit on 02/18/17  CBC with Differential/Platelet  Result Value Ref Range   WBC 5.0 4.0 - 10.5 K/uL   RBC 5.06 4.22 - 5.81 Mil/uL   Hemoglobin 16.0 13.0 - 17.0 g/dL   HCT 46.3 39.0 - 52.0 %   MCV 91.4 78.0 - 100.0 fl   MCHC 34.7 30.0 - 36.0 g/dL   RDW 12.8 11.5 - 15.5 %   Platelets 254.0 150.0 - 400.0 K/uL   Neutrophils Relative % 56.1 43.0 - 77.0 %   Lymphocytes Relative 24.7 12.0 - 46.0 %   Monocytes Relative 11.7 3.0 - 12.0 %   Eosinophils Relative 6.4 (H) 0.0 - 5.0 %   Basophils Relative 1.1 0.0 - 3.0 %   Neutro Abs 2.8 1.4 - 7.7 K/uL   Lymphs Abs 1.2 0.7 - 4.0 K/uL   Monocytes Absolute 0.6 0.1 - 1.0 K/uL   Eosinophils Absolute 0.3 0.0 - 0.7 K/uL   Basophils Absolute 0.1 0.0 - 0.1 K/uL  Comprehensive metabolic panel  Result Value Ref Range   Sodium 138 135 - 145 mEq/L   Potassium 4.7 3.5 - 5.1 mEq/L   Chloride 100 96 - 112 mEq/L   CO2 32 19 - 32 mEq/L   Glucose, Bld 120 (H) 70 - 99 mg/dL   BUN 21 6 - 23 mg/dL   Creatinine, Ser 1.33 0.40 -  1.50 mg/dL   Total Bilirubin 0.6 0.2 - 1.2 mg/dL   Alkaline Phosphatase 70 39 - 117 U/L   AST 17 0 - 37 U/L   ALT 21 0 - 53 U/L   Total Protein 7.0 6.0 - 8.3 g/dL   Albumin 4.5 3.5 - 5.2 g/dL   Calcium 9.9 8.4 - 10.5 mg/dL   GFR 62.16 >60.00 mL/min  Lipid panel  Result Value Ref Range   Cholesterol 163 0 - 200 mg/dL   Triglycerides 90.0 0.0 - 149.0 mg/dL   HDL 38.90 (L) >39.00 mg/dL   VLDL 18.0 0.0 - 40.0 mg/dL   LDL Cholesterol 106 (H) 0 - 99 mg/dL   Total CHOL/HDL Ratio 4    NonHDL 123.99   PSA  Result Value Ref Range   PSA 0.18 0.10 - 4.00 ng/mL  TSH  Result Value Ref Range   TSH 2.21 0.35 - 4.50 uIU/mL    Glucose 120- he forgot to fast / had coffee with sugar   Patient Active Problem List   Diagnosis Date Noted  . Prostate cancer screening 02/13/2017  . Colon cancer screening 08/19/2014  . Family history of colon cancer 05/15/2013  . Dysphagia 04/28/2012  . Routine general medical examination at a health care facility 03/25/2011  . Seasonal allergies   . Hyperlipidemia 03/13/2010  . ERECTILE DYSFUNCTION 12/11/2009  . ASTHMA 12/11/2009  . GERD  12/11/2009   Past Medical History:  Diagnosis Date  . Asthma   . Dysphagia   . ED (erectile dysfunction)   . GERD (gastroesophageal reflux disease)   . Hyperlipidemia   . Seasonal allergies    No past surgical history on file. Social History  Substance Use Topics  . Smoking status: Former Smoker    Quit date: 12/06/2008  . Smokeless tobacco: Never Used  . Alcohol use 0.0 oz/week     Comment: Occasional-weekends   Family History  Problem Relation Age of Onset  . Ovarian cancer Maternal Grandmother   . Lung cancer Maternal Grandfather   . COPD Maternal Grandfather   . Colon cancer Father     in his 40s  . Ovarian cancer Paternal Grandmother    Allergies  Allergen Reactions  . Shrimp [Shellfish Allergy] Hives and Shortness Of Breath  . Codeine Nausea Only   Current Outpatient Prescriptions on File Prior to Visit  Medication Sig Dispense Refill  . albuterol (VENTOLIN HFA) 108 (90 BASE) MCG/ACT inhaler Inhale 2 puffs into the lungs every 4 (four) hours as needed. 1 Inhaler 5  . diphenhydrAMINE (BENADRYL) 25 MG tablet Take 2 tablets (50 mg total) by mouth every 4 (four) hours as needed for itching or allergies. 20 tablet 0  . EPINEPHrine (EPIPEN) 0.3 mg/0.3 mL IJ SOAJ injection Inject 0.3 mLs (0.3 mg total) into the muscle as needed. 1 Device 2  . loratadine (CLARITIN) 10 MG tablet Take 10 mg by mouth daily as needed for allergies.    Marland Kitchen oxyCODONE-acetaminophen (PERCOCET) 5-325 MG tablet Take 1 tablet by mouth every 4 (four) hours as needed for severe pain. 20 tablet 0  . promethazine (PHENERGAN) 12.5 MG tablet Take 1-2 every 6 hours prn nausea 30 tablet 0  . fluticasone (FLONASE) 50 MCG/ACT nasal spray Place 2 sprays into both nostrils daily. 16 g 11   No current facility-administered medications on file prior to visit.      Review of Systems Review of Systems  Constitutional: Negative for fever, appetite change, fatigue and unexpected weight change.  Eyes:  Negative for pain  and visual disturbance.  Respiratory: Negative for cough and shortness of breath.   Cardiovascular: Negative for cp or palpitations    Gastrointestinal: Negative for nausea, diarrhea and constipation.  Genitourinary: Negative for urgency and frequency.  Skin: Negative for pallor or rash   Neurological: Negative for weakness, light-headedness, numbness and headaches.  Hematological: Negative for adenopathy. Does not bruise/bleed easily.  Psychiatric/Behavioral: Negative for dysphoric mood. The patient is not nervous/anxious.         Objective:   Physical Exam  Constitutional: He appears well-developed and well-nourished. No distress.  Well appearing  Muscular build - suspect low body fat and not overwt   HENT:  Head: Normocephalic and atraumatic.  Right Ear: External ear normal.  Left Ear: External ear normal.  Nose: Nose normal.  Mouth/Throat: Oropharynx is clear and moist.  Eyes: Conjunctivae and EOM are normal. Pupils are equal, round, and reactive to light. Right eye exhibits no discharge. Left eye exhibits no discharge. No scleral icterus.  Neck: Normal range of motion. Neck supple. No JVD present. Carotid bruit is not present. No thyromegaly present.  Cardiovascular: Normal rate, regular rhythm, normal heart sounds and intact distal pulses.  Exam reveals no gallop.   Pulmonary/Chest: Effort normal and breath sounds normal. No respiratory distress. He has no wheezes. He exhibits no tenderness.  Abdominal: Soft. Bowel sounds are normal. He exhibits no distension, no abdominal bruit and no mass. There is no tenderness.  Musculoskeletal: He exhibits no edema or tenderness.  Lymphadenopathy:    He has no cervical adenopathy.  Neurological: He is alert. He has normal reflexes. No cranial nerve deficit. He exhibits normal muscle tone. Coordination normal.  Skin: Skin is warm and dry. No rash noted. No erythema. No pallor.  Mildly tanned with some stable brown nevi on trunk    Psychiatric: He has a normal mood and affect.          Assessment & Plan:   Problem List Items Addressed This Visit      Other   Colon cancer screening    Ins will not cover colonoscopy until 46 yo Given info on cologuard if covered  If not will do ifob      ERECTILE DYSFUNCTION    cialis works best-this was refilled      Family history of colon cancer    Father at 68 Unfortunately his ins will not cover colonoscopy until age 4  Given info on cologuard -will do this if ins cov  If not- will do ifob  He will let us know  No bowel changes       Hyperlipidemia    Improved triglycerides with better diet  Disc goals for lipids and reasons to control them Rev labs with pt Rev low sat fat diet in detail  Enc to keep exercising      Relevant Medications   tadalafil (CIALIS) 20 MG tablet   Prostate cancer screening    Lab Results  Component Value Date   PSA 0.18 02/18/2017   No fam hx  No urinary symptoms or nocturia      Routine general medical examination at a health care facility - Primary    Reviewed health habits including diet and exercise and skin cancer prevention Reviewed appropriate screening tests for age  Also reviewed health mt list, fam hx and immunization status , as well as social and family history   See HPI Labs reviewed Commended on better habits and fitness Of note:  bmi is up but he has been building muscle and I suspect body fat is way down  For colon cancer screening-since ins does not cover colonoscopy (father had colon cancer at 64) - I gave him info on cologuard screening - he will let us know if it is covered (if not will give ifob kit)

## 2017-02-23 NOTE — Patient Instructions (Addendum)
Check with your insurance regarding coverage of Cologuard test  Let us know , If not covered - we will give you our ifob stool cards  Given family history - I prefer the Cologuard  Keep exercising and eating a healthy diet  Make sure to drink enough water

## 2017-02-23 NOTE — Assessment & Plan Note (Signed)
Ins will not cover colonoscopy until 46 yo Given info on cologuard if covered  If not will do ifob

## 2017-02-23 NOTE — Assessment & Plan Note (Signed)
Reviewed health habits including diet and exercise and skin cancer prevention Reviewed appropriate screening tests for age  Also reviewed health mt list, fam hx and immunization status , as well as social and family history   See HPI Labs reviewed Commended on better habits and fitness Of note: bmi is up but he has been building muscle and I suspect body fat is way down  For colon cancer screening-since ins does not cover colonoscopy (father had colon cancer at 43) - I gave him info on cologuard screening - he will let us know if it is covered (if not will give ifob kit)

## 2017-02-23 NOTE — Assessment & Plan Note (Signed)
Father at [id number] Unfortunately his ins will not cover colonoscopy until age 44  Given info on cologuard -will do this if ins cov  If not- will do ifob  He will let us know  No bowel changes

## 2017-05-09 IMAGING — CR DG TIBIA/FIBULA 2V*L*
1 series · 2 of 2 positions shown · non-contrast
Comparison: None.

CLINICAL DATA: Left lower extremity injury

EXAM:
LEFT TIBIA AND FIBULA - 2 VIEW

[Series 1: dg tibia/fibula left · 0.14mm/px · 2 of 2 slices shown]
[im 1/2]
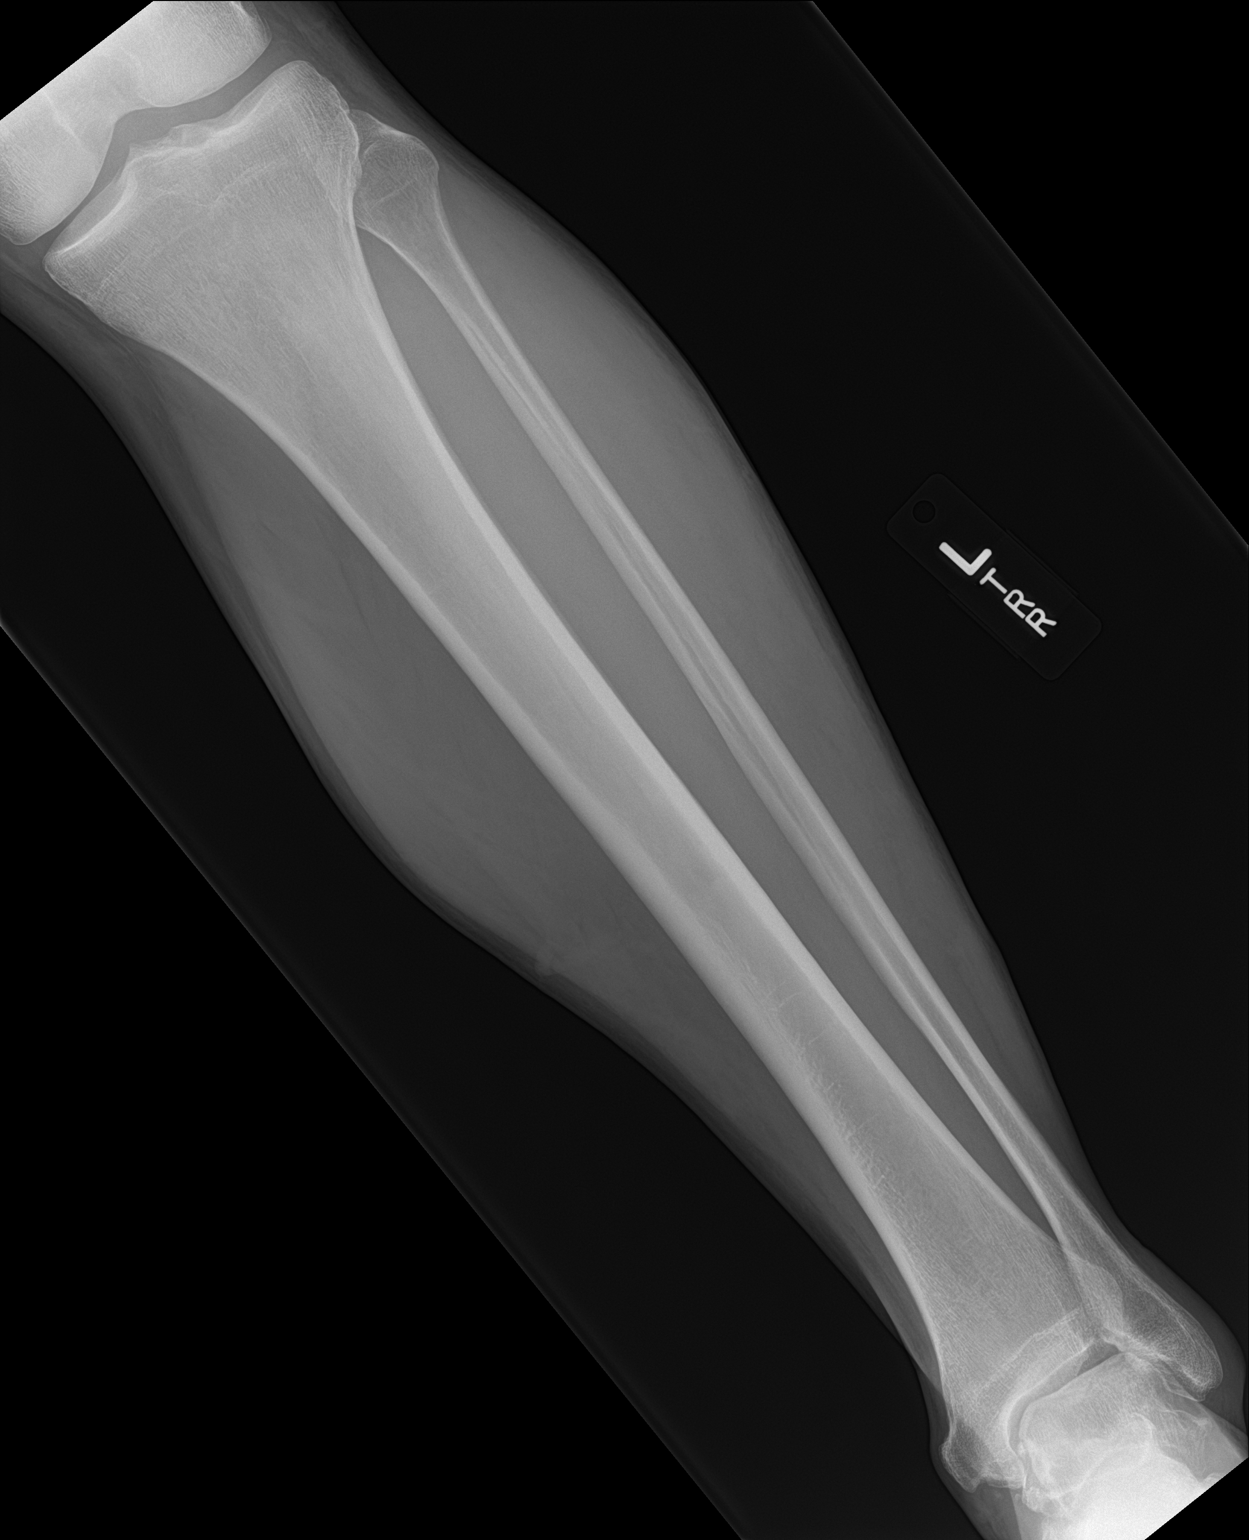
[im 2/2]
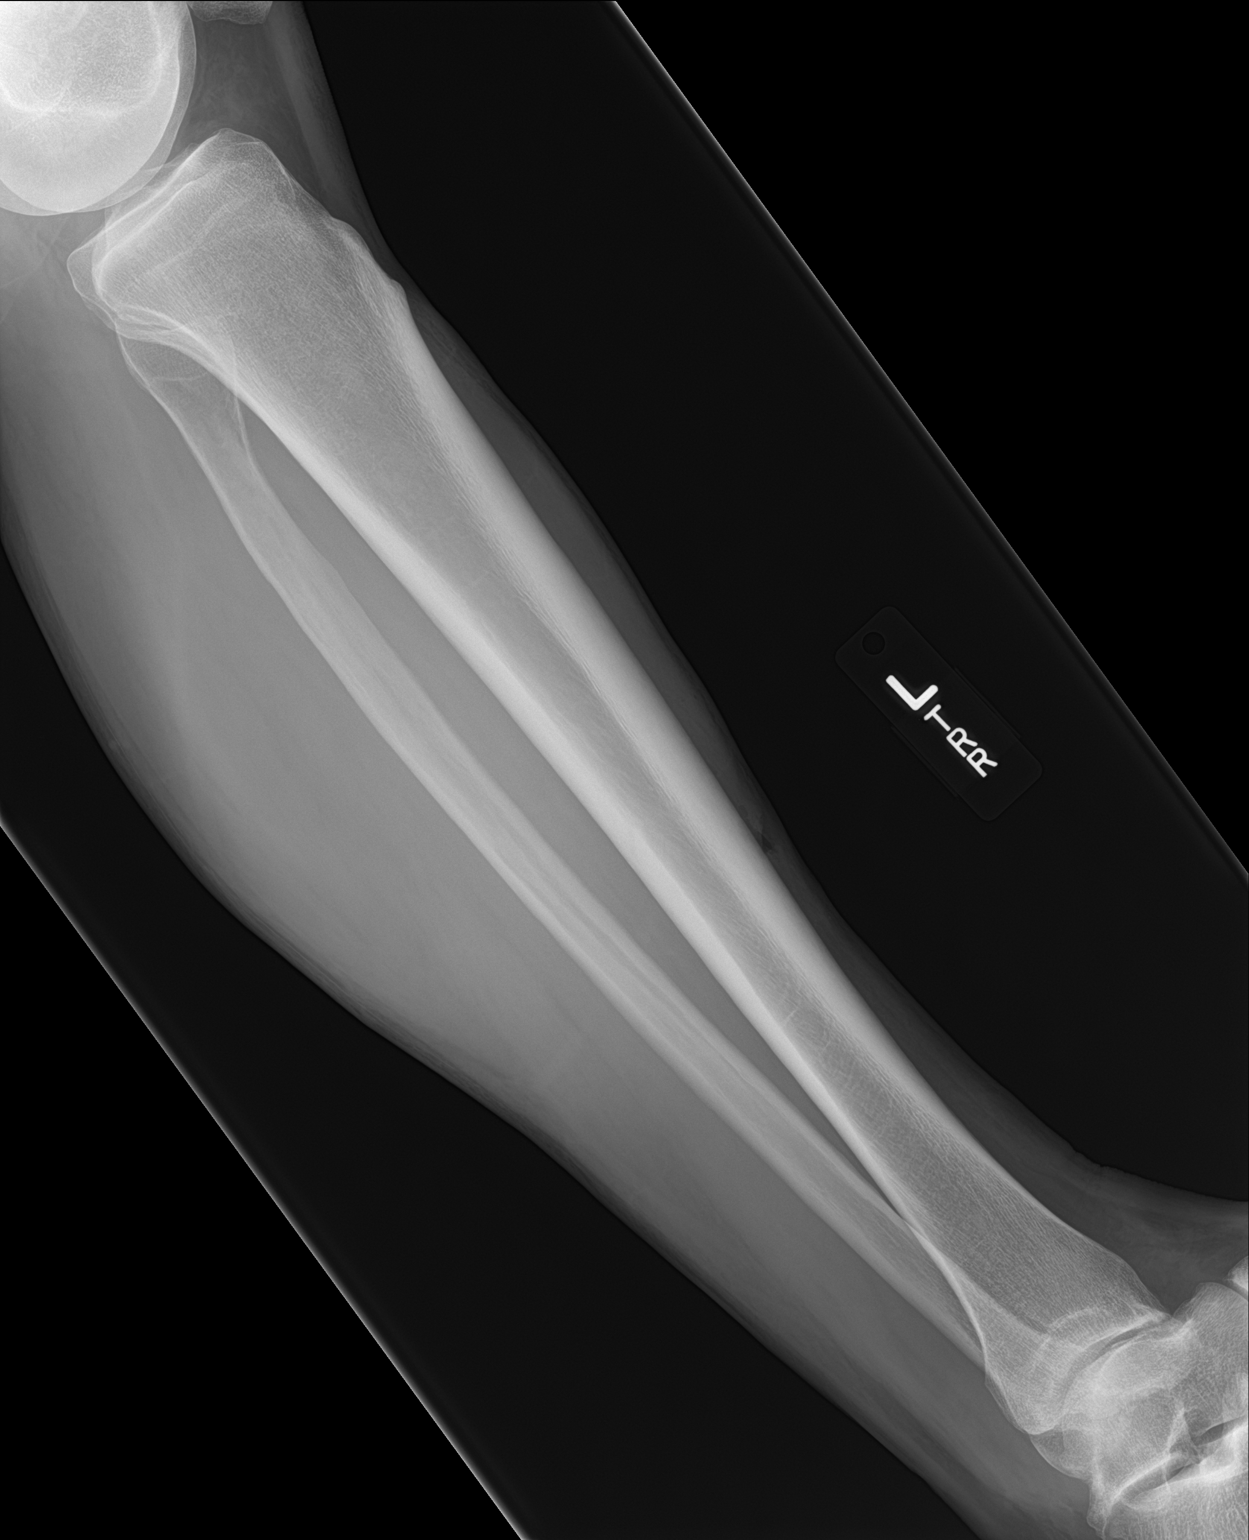

[2 of 2 positions shown; findings below may reference images not displayed]

FINDINGS: Anterior soft tissue laceration at the level of the mid left tibial
shaft. No radiopaque foreign body. No fracture. No suspicious focal
osseous lesion.
IMPRESSION: Anterior soft tissue laceration at the level of the mid left tibial
shaft. No fracture or radiopaque foreign body.

## 2018-02-01 ENCOUNTER — Telehealth: Payer: Self-pay | Admitting: Family Medicine

## 2018-02-01 NOTE — Telephone Encounter (Signed)
Copied from CRM [id number]. Topic: Quick Communication - See Telephone Encounter >> Feb 01, 2018  9:55 AM Windy KalataMichael, Jabriel Vanduyne L, NT wrote: CRM for notification. See Telephone encounter for:  02/01/18.  Patient wife called to schedule patients physical appt. She states he needs a refill on all of his medications prescribed by Dr. Milinda Antisower. She states she does not know the name of them but knows he needs a refill on the ones that he takes on a regular bases. Told spouse we need to know the name of them she stated again she does not know the name of them but they are in his chart. Please advise.  [facility] [address] - [place], [place] - [id number]

## 2018-02-01 NOTE — Telephone Encounter (Signed)
I do not see that on his med list either-please call and ask him about details -thanks  Please refill anything from us chronically (flonase/protonix/cialis I think) until PE if he needs them Thanks

## 2018-02-01 NOTE — Telephone Encounter (Signed)
Called pt directly and he is requesting a refill of sertraline 50 mg, not on med list please advise

## 2018-02-02 MED ORDER — SERTRALINE HCL 50 MG PO TABS: 50.0000 mg | ORAL_TABLET | Freq: Every day | ORAL | 1 refills | Status: DC

## 2018-02-02 NOTE — Telephone Encounter (Signed)
I sent it to [facility] to get by until I see him in march

## 2018-02-02 NOTE — Telephone Encounter (Signed)
Pt notified of Dr. Royden Purlower's comments and that the Rx was sent to pharmacy

## 2018-02-02 NOTE — Telephone Encounter (Signed)
Pt said that he has been seeing a psychiatrist through a telemedicine (phone call) program that was offered through his job. She was the one that prescribed the sertraline 50mg  (once daily), but he can no longer use that program and would like for Dr. Milinda Antisower to take over prescribing this medication. Pt said that the original doctor that prescribed this was Dr. Buddy DutyHolly Christen Love, he thinks she may be located in [place] but not sure since he just talked to her via phone calls. Please advise

## 2018-02-23 ENCOUNTER — Telehealth: Payer: Self-pay | Admitting: Family Medicine

## 2018-02-23 DIAGNOSIS — Z125 Encounter for screening for malignant neoplasm of prostate: Secondary | ICD-10-CM

## 2018-02-23 DIAGNOSIS — E7800 Pure hypercholesterolemia, unspecified: Secondary | ICD-10-CM

## 2018-02-23 DIAGNOSIS — Z0000 Encounter for general adult medical examination without abnormal findings: Secondary | ICD-10-CM

## 2018-02-23 NOTE — Telephone Encounter (Signed)
-----   Message from Terri J Walsh sent at 02/15/2018 11:19 AM EDT ----- Regarding: Lab orders for Friday, 3.22.19 Patient is scheduled for CPX labs, please order future labs, Thanks , Terri  

## 2018-02-24 ENCOUNTER — Other Ambulatory Visit (INDEPENDENT_AMBULATORY_CARE_PROVIDER_SITE_OTHER): Payer: 59

## 2018-02-24 DIAGNOSIS — Z125 Encounter for screening for malignant neoplasm of prostate: Secondary | ICD-10-CM

## 2018-02-24 DIAGNOSIS — Z0000 Encounter for general adult medical examination without abnormal findings: Secondary | ICD-10-CM | POA: Diagnosis not present

## 2018-02-24 DIAGNOSIS — E7800 Pure hypercholesterolemia, unspecified: Secondary | ICD-10-CM | POA: Diagnosis not present

## 2018-02-24 LAB — CBC WITH DIFFERENTIAL/PLATELET
BASOS PCT: 1 % (ref 0.0–3.0)
Basophils Absolute: 0.1 10*3/uL (ref 0.0–0.1)
Eosinophils Absolute: 0.3 10*3/uL (ref 0.0–0.7)
Eosinophils Relative: 5.8 % — ABNORMAL HIGH (ref 0.0–5.0)
HEMATOCRIT: 45.8 % (ref 39.0–52.0)
Hemoglobin: 15.8 g/dL (ref 13.0–17.0)
LYMPHS PCT: 28 % (ref 12.0–46.0)
Lymphs Abs: 1.4 10*3/uL (ref 0.7–4.0)
MCHC: 34.5 g/dL (ref 30.0–36.0)
MCV: 92 fl (ref 78.0–100.0)
Monocytes Absolute: 0.6 10*3/uL (ref 0.1–1.0)
Monocytes Relative: 12.1 % — ABNORMAL HIGH (ref 3.0–12.0)
NEUTROS ABS: 2.7 10*3/uL (ref 1.4–7.7)
Neutrophils Relative %: 53.1 % (ref 43.0–77.0)
PLATELETS: 257 10*3/uL (ref 150.0–400.0)
RBC: 4.98 Mil/uL (ref 4.22–5.81)
RDW: 13 % (ref 11.5–15.5)
WBC: 5 10*3/uL (ref 4.0–10.5)

## 2018-02-24 LAB — COMPREHENSIVE METABOLIC PANEL
ALT: 25 U/L (ref 0–53)
AST: 17 U/L (ref 0–37)
Albumin: 4.3 g/dL (ref 3.5–5.2)
Alkaline Phosphatase: 66 U/L (ref 39–117)
BUN: 20 mg/dL (ref 6–23)
CALCIUM: 9.3 mg/dL (ref 8.4–10.5)
CHLORIDE: 103 meq/L (ref 96–112)
CO2: 28 meq/L (ref 19–32)
Creatinine, Ser: 1.26 mg/dL (ref 0.40–1.50)
GFR: 65.85 mL/min (ref >60.00)
Glucose, Bld: 110 mg/dL — ABNORMAL HIGH (ref 70–99)
Potassium: 4.5 mEq/L (ref 3.5–5.1)
Sodium: 140 mEq/L (ref 135–145)
Total Bilirubin: 0.7 mg/dL (ref 0.2–1.2)
Total Protein: 6.9 g/dL (ref 6.0–8.3)

## 2018-02-24 LAB — LIPID PANEL
Cholesterol: 191 mg/dL (ref 0–200)
HDL: 43.3 mg/dL (ref >39.00)
LDL Cholesterol: 122 mg/dL — ABNORMAL HIGH (ref 0–99)
NONHDL: 147.27
Total CHOL/HDL Ratio: 4
Triglycerides: 125 mg/dL (ref 0.0–149.0)
VLDL: 25 mg/dL (ref 0.0–40.0)

## 2018-02-24 LAB — TSH: TSH: 2.34 u[IU]/mL (ref 0.35–4.50)

## 2018-02-24 LAB — PSA: PSA: 0.22 ng/mL (ref 0.10–4.00)

## 2018-03-03 ENCOUNTER — Ambulatory Visit (INDEPENDENT_AMBULATORY_CARE_PROVIDER_SITE_OTHER): Payer: 59 | Admitting: Family Medicine

## 2018-03-03 ENCOUNTER — Encounter: Payer: Self-pay | Admitting: Family Medicine

## 2018-03-03 VITALS — BP 116/74 | HR 76 | Temp 98.4°F | Ht 73.5 in | Wt 227.5 lb

## 2018-03-03 DIAGNOSIS — Z800 Family history of malignant neoplasm of digestive organs: Secondary | ICD-10-CM | POA: Diagnosis not present

## 2018-03-03 DIAGNOSIS — F528 Other sexual dysfunction not due to a substance or known physiological condition: Secondary | ICD-10-CM

## 2018-03-03 DIAGNOSIS — Z0000 Encounter for general adult medical examination without abnormal findings: Secondary | ICD-10-CM

## 2018-03-03 DIAGNOSIS — R7309 Other abnormal glucose: Secondary | ICD-10-CM | POA: Diagnosis not present

## 2018-03-03 DIAGNOSIS — Z125 Encounter for screening for malignant neoplasm of prostate: Secondary | ICD-10-CM | POA: Diagnosis not present

## 2018-03-03 DIAGNOSIS — K219 Gastro-esophageal reflux disease without esophagitis: Secondary | ICD-10-CM | POA: Diagnosis not present

## 2018-03-03 DIAGNOSIS — Z1211 Encounter for screening for malignant neoplasm of colon: Secondary | ICD-10-CM | POA: Diagnosis not present

## 2018-03-03 DIAGNOSIS — F4010 Social phobia, unspecified: Secondary | ICD-10-CM | POA: Diagnosis not present

## 2018-03-03 DIAGNOSIS — E7800 Pure hypercholesterolemia, unspecified: Secondary | ICD-10-CM

## 2018-03-03 MED ORDER — FLUTICASONE PROPIONATE 50 MCG/ACT NA SUSP: 2.0000 | Freq: Every day | NASAL | 11 refills | Status: DC

## 2018-03-03 MED ORDER — TADALAFIL 20 MG PO TABS: ORAL_TABLET | ORAL | 11 refills | Status: DC

## 2018-03-03 MED ORDER — PANTOPRAZOLE SODIUM 40 MG PO TBEC: 40.0000 mg | DELAYED_RELEASE_TABLET | Freq: Two times a day (BID) | ORAL | 3 refills | Status: DC

## 2018-03-03 MED ORDER — SERTRALINE HCL 50 MG PO TABS: 50.0000 mg | ORAL_TABLET | Freq: Every day | ORAL | 1 refills | Status: DC

## 2018-03-03 NOTE — Assessment & Plan Note (Signed)
Doing well if no missed doses of protonix  Also watches diet  No change in tx

## 2018-03-03 NOTE — Patient Instructions (Addendum)
Please do the IFOB kit for colon screening   For cholesterol  Avoid red meat/ fried foods/ egg yolks/ fatty breakfast meats/ butter, cheese and high fat dairy/ and shellfish    To prevent diabetes Try to get most of your carbohydrates from produce (with the exception of white potatoes)  Eat less bread/pasta/rice/snack foods/cereals/sweets and other items from the middle of the grocery store (processed carbs)  If you become interested in counseling (talk therapy ) for anxiety -call and let us know

## 2018-03-03 NOTE — Progress Notes (Signed)
Subjective:    Patient ID: Mario Lawrence, male    DOB: 1970/12/08, 47 y.o.   MRN: [id number]  HPI Here for health maintenance exam and to review chronic medical problems    Really busy -working a lot  Feels very good overall  Nothing new physically - did not get a cold this season  Wt Readings from Last 3 Encounters:  03/03/18 227 lb 8 oz (103.2 kg)  02/23/17 230 lb 12 oz (104.7 kg)  03/04/16 215 lb (97.5 kg)  wt is down 3 lb  Exercises 3-4 days per week  Doing well with diet- lean meat /low fat and cut some carbs (processed)- eating better  Feels better with less carbs  29.61 kg/m   Flu vaccine -forgot this year/occ gets at work   Tetanus shot 3/17  Prostate health Lab Results  Component Value Date   PSA 0.22 02/24/2018   PSA 0.18 02/18/2017   no family hx of prostate cancer  No problems with urination- no nocturia   Hx of ED cialis - still as needed and it works well w/o side effects  March Rummage is finally coming down   His father had colon cancer in his 63s Insurance would not cover until age 92 as well as the cologuard  Will do ifob kit  No stool changes or blood in stool    Hyperlipidemia Lab Results  Component Value Date   CHOL 191 02/24/2018   CHOL 163 02/18/2017   CHOL 179 01/30/2016   Lab Results  Component Value Date   HDL 43.30 02/24/2018   HDL 38.90 (L) 02/18/2017   HDL 39.50 01/30/2016   Lab Results  Component Value Date   LDLCALC 122 (H) 02/24/2018   LDLCALC 106 (H) 02/18/2017   LDLCALC 106 (H) 01/30/2016   Lab Results  Component Value Date   TRIG 125.0 02/24/2018   TRIG 90.0 02/18/2017   TRIG 165.0 (H) 01/30/2016   Lab Results  Component Value Date   CHOLHDL 4 02/24/2018   CHOLHDL 4 02/18/2017   CHOLHDL 5 01/30/2016   Lab Results  Component Value Date   LDLDIRECT 153.7 12/11/2009   LDL went up a bit    Other labs Results for orders placed or performed in visit on 02/24/18  Lipid panel  Result Value Ref Range   Cholesterol 191 0 - 200 mg/dL   Triglycerides 125.0 0.0 - 149.0 mg/dL   HDL 43.30 >39.00 mg/dL   VLDL 25.0 0.0 - 40.0 mg/dL   LDL Cholesterol 122 (H) 0 - 99 mg/dL   Total CHOL/HDL Ratio 4    NonHDL 147.27   PSA  Result Value Ref Range   PSA 0.22 0.10 - 4.00 ng/mL  TSH  Result Value Ref Range   TSH 2.34 0.35 - 4.50 uIU/mL  CBC with Differential/Platelet  Result Value Ref Range   WBC 5.0 4.0 - 10.5 K/uL   RBC 4.98 4.22 - 5.81 Mil/uL   Hemoglobin 15.8 13.0 - 17.0 g/dL   HCT 45.8 39.0 - 52.0 %   MCV 92.0 78.0 - 100.0 fl   MCHC 34.5 30.0 - 36.0 g/dL   RDW 13.0 11.5 - 15.5 %   Platelets 257.0 150.0 - 400.0 K/uL   Neutrophils Relative % 53.1 43.0 - 77.0 %   Lymphocytes Relative 28.0 12.0 - 46.0 %   Monocytes Relative 12.1 (H) 3.0 - 12.0 %   Eosinophils Relative 5.8 (H) 0.0 - 5.0 %   Basophils Relative 1.0 0.0 - 3.0 %  Neutro Abs 2.7 1.4 - 7.7 K/uL   Lymphs Abs 1.4 0.7 - 4.0 K/uL   Monocytes Absolute 0.6 0.1 - 1.0 K/uL   Eosinophils Absolute 0.3 0.0 - 0.7 K/uL   Basophils Absolute 0.1 0.0 - 0.1 K/uL  Comprehensive metabolic panel  Result Value Ref Range   Sodium 140 135 - 145 mEq/L   Potassium 4.5 3.5 - 5.1 mEq/L   Chloride 103 96 - 112 mEq/L   CO2 28 19 - 32 mEq/L   Glucose, Bld 110 (H) 70 - 99 mg/dL   BUN 20 6 - 23 mg/dL   Creatinine, Ser 1.26 0.40 - 1.50 mg/dL   Total Bilirubin 0.7 0.2 - 1.2 mg/dL   Alkaline Phosphatase 66 39 - 117 U/L   AST 17 0 - 37 U/L   ALT 25 0 - 53 U/L   Total Protein 6.9 6.0 - 8.3 g/dL   Albumin 4.3 3.5 - 5.2 g/dL   Calcium 9.3 8.4 - 10.5 mg/dL   GFR 65.85 >60.00 mL/min    Glucose 110 (drank coffee with sugar in it)  His father had borderline DM  zoloft for mood -has helped a lot  Had a telemedicine visit with psychiatry - for anxiety (social anxiety) Panic attacks playing music at church became a huge problem /made it difficult to sing  Also meetings at work  [employer]- claustrophobic   Is overall improved -but still some issues  with being in front of people  Side effects-some stomach issues   Has f/u soon to discuss further   Pulse Readings from Last 3 Encounters:  03/03/18 76  02/23/17 65  03/04/16 84   BP Readings from Last 3 Encounters:  03/03/18 116/74  02/23/17 122/78  03/04/16 (!) 173/69    Patient Active Problem List   Diagnosis Date Noted  . Elevated random blood glucose level 03/03/2018  . Social anxiety disorder 03/03/2018  . Prostate cancer screening 02/13/2017  . Colon cancer screening 08/19/2014  . Family history of colon cancer 05/15/2013  . Dysphagia 04/28/2012  . Routine general medical examination at a health care facility 03/25/2011  . Seasonal allergies   . Hyperlipidemia 03/13/2010  . ERECTILE DYSFUNCTION 12/11/2009  . ASTHMA 12/11/2009  . GERD 12/11/2009   Past Medical History:  Diagnosis Date  . Asthma   . Dysphagia   . ED (erectile dysfunction)   . GERD (gastroesophageal reflux disease)   . Hyperlipidemia   . Seasonal allergies    No past surgical history on file. Social History   Tobacco Use  . Smoking status: Former Smoker    Last attempt to quit: 12/06/2008    Years since quitting: 9.2  . Smokeless tobacco: Never Used  Substance Use Topics  . Alcohol use: Yes    Alcohol/week: 0.0 oz    Comment: Occasional-weekends  . Drug use: No   Family History  Problem Relation Age of Onset  . Ovarian cancer Maternal Grandmother   . Lung cancer Maternal Grandfather   . COPD Maternal Grandfather   . Colon cancer Father        in his 50s  . Ovarian cancer Paternal Grandmother    Allergies  Allergen Reactions  . Shrimp [Shellfish Allergy] Hives and Shortness Of Breath  . Codeine Nausea Only   Current Outpatient Medications on File Prior to Visit  Medication Sig Dispense Refill  . albuterol (VENTOLIN HFA) 108 (90 BASE) MCG/ACT inhaler Inhale 2 puffs into the lungs every 4 (four) hours as needed. 1  Inhaler 5  . diphenhydrAMINE (BENADRYL) 25 MG tablet Take 2 tablets  (50 mg total) by mouth every 4 (four) hours as needed for itching or allergies. 20 tablet 0  . EPINEPHrine (EPIPEN) 0.3 mg/0.3 mL IJ SOAJ injection Inject 0.3 mLs (0.3 mg total) into the muscle as needed. 1 Device 2  . loratadine (CLARITIN) 10 MG tablet Take 10 mg by mouth daily as needed for allergies.     No current facility-administered medications on file prior to visit.     Review of Systems  Constitutional: Negative for activity change, appetite change, fatigue, fever and unexpected weight change.  HENT: Negative for congestion, rhinorrhea, sore throat and trouble swallowing.   Eyes: Negative for pain, redness, itching and visual disturbance.  Respiratory: Negative for cough, chest tightness, shortness of breath and wheezing.   Cardiovascular: Negative for chest pain and palpitations.  Gastrointestinal: Negative for abdominal pain, blood in stool, constipation, diarrhea and nausea.  Endocrine: Negative for cold intolerance, heat intolerance, polydipsia and polyuria.  Genitourinary: Negative for difficulty urinating, dysuria, frequency and urgency.  Musculoskeletal: Negative for arthralgias, joint swelling and myalgias.  Skin: Negative for pallor and rash.  Neurological: Negative for dizziness, tremors, weakness, numbness and headaches.  Hematological: Negative for adenopathy. Does not bruise/bleed easily.  Psychiatric/Behavioral: Negative for decreased concentration and dysphoric mood. The patient is not nervous/anxious.        Objective:   Physical Exam  Constitutional: He appears well-developed and well-nourished. No distress.  Well appearing   HENT:  Head: Normocephalic and atraumatic.  Right Ear: External ear normal.  Left Ear: External ear normal.  Nose: Nose normal.  Mouth/Throat: Oropharynx is clear and moist.  Nares are boggy  Eyes: Pupils are equal, round, and reactive to light. Conjunctivae and EOM are normal. Right eye exhibits no discharge. Left eye exhibits no  discharge. No scleral icterus.  Neck: Normal range of motion. Neck supple. No JVD present. Carotid bruit is not present. No thyromegaly present.  Cardiovascular: Normal rate, regular rhythm, normal heart sounds and intact distal pulses. Exam reveals no gallop.  Pulmonary/Chest: Effort normal and breath sounds normal. No respiratory distress. He has no wheezes. He exhibits no tenderness.  Abdominal: Soft. Bowel sounds are normal. He exhibits no distension, no abdominal bruit and no mass. There is no tenderness.  Musculoskeletal: He exhibits no edema or tenderness.  Lymphadenopathy:    He has no cervical adenopathy.  Neurological: He is alert. He has normal reflexes. He displays no tremor. No cranial nerve deficit. He exhibits normal muscle tone. Coordination normal.  Skin: Skin is warm and dry. No rash noted. No erythema. No pallor.  Tanned Some lentigines   Psychiatric: He has a normal mood and affect.  Nl mood  Not overly anxious today          Assessment & Plan:   Problem List Items Addressed This Visit      Digestive   GERD (Chronic)    Doing well if no missed doses of protonix  Also watches diet  No change in tx       Relevant Medications   pantoprazole (PROTONIX) 40 MG tablet     Other   Colon cancer screening    Father had colon cancer at 36 His insurance will not pay for colonoscopy until 50 or cologuard at any time  ifob kit given      Elevated random blood glucose level    disc imp of low glycemic diet and wt loss to prevent  DM2       ERECTILE DYSFUNCTION    Continue cialis prn  PSA reassuring/no other symptoms      Family history of colon cancer    Pt's ins will not pay for colonoscopy until 32 Or cologard  Given ifob kit       Hyperlipidemia    Disc goals for lipids and reasons to control them Rev labs with pt Rev low sat fat diet in detail LDL is up a bit to 122 but HDL now over 40 Continue to watch       Relevant Medications   tadalafil  (CIALIS) 20 MG tablet   Prostate cancer screening    No symptoms No fam hx Lab Results  Component Value Date   PSA 0.22 02/24/2018   PSA 0.18 02/18/2017         Routine general medical examination at a health care facility - Primary    Reviewed health habits including diet and exercise and skin cancer prevention Reviewed appropriate screening tests for age  Also reviewed health mt list, fam hx and immunization status , as well as social and family history   See HPI Labs rev  Disc plan for social anx disorder  ifob kit for colon screen Disc diet for cholesterol and dm prev      Social anxiety disorder    New diagnosis this year  Has on line psychiatrist- plans to f/u Doing well with zoloft 50  No counseling-he declines Disc imp of self care Suggested meditation  Refilled zoloft to get by until next psychiatry appt Reviewed stressors/ coping techniques/symptoms/ support sources/ tx options and side effects in detail today  Still has some stage fright issues- ? If beta blocker would help -he will disc with his doctor Enc exercise      Relevant Medications   sertraline (ZOLOFT) 50 MG tablet

## 2018-03-05 NOTE — Assessment & Plan Note (Signed)
Continue cialis prn  PSA reassuring/no other symptoms

## 2018-03-05 NOTE — Assessment & Plan Note (Signed)
Disc goals for lipids and reasons to control them Rev labs with pt Rev low sat fat diet in detail LDL is up a bit to 122 but HDL now over 40 Continue to watch

## 2018-03-05 NOTE — Assessment & Plan Note (Signed)
disc imp of low glycemic diet and wt loss to prevent DM2  

## 2018-03-05 NOTE — Assessment & Plan Note (Signed)
No symptoms No fam hx Lab Results  Component Value Date   PSA 0.22 02/24/2018   PSA 0.18 02/18/2017

## 2018-03-05 NOTE — Assessment & Plan Note (Signed)
Pt's ins will not pay for colonoscopy until 52 Or cologard  Given ifob kit

## 2018-03-05 NOTE — Assessment & Plan Note (Signed)
Reviewed health habits including diet and exercise and skin cancer prevention Reviewed appropriate screening tests for age  Also reviewed health mt list, fam hx and immunization status , as well as social and family history   See HPI Labs rev  Disc plan for social anx disorder  ifob kit for colon screen Disc diet for cholesterol and dm prev

## 2018-03-05 NOTE — Assessment & Plan Note (Signed)
New diagnosis this year  Has on line psychiatrist- plans to f/u Doing well with zoloft 52  No counseling-he declines Disc imp of self care Suggested meditation  Refilled zoloft to get by until next psychiatry appt Reviewed stressors/ coping techniques/symptoms/ support sources/ tx options and side effects in detail today  Still has some stage fright issues- ? If beta blocker would help -he will disc with his doctor Enc exercise

## 2018-03-05 NOTE — Assessment & Plan Note (Signed)
Father had colon cancer at 23 His insurance will not pay for colonoscopy until 50 or cologuard at any time  ifob kit given

## 2018-04-07 ENCOUNTER — Ambulatory Visit: Payer: 59 | Admitting: Family Medicine

## 2018-04-07 ENCOUNTER — Encounter: Payer: Self-pay | Admitting: Family Medicine

## 2018-04-07 DIAGNOSIS — K148 Other diseases of tongue: Secondary | ICD-10-CM

## 2018-04-07 MED ORDER — TRIAMCINOLONE ACETONIDE 0.1 % MT PSTE: 1.0000 "application " | PASTE | Freq: Two times a day (BID) | OROMUCOSAL | 1 refills | Status: DC

## 2018-04-07 NOTE — Patient Instructions (Signed)
Change to a baking soda and peroxide toothpaste for now   Avoid OJ/ tomatoes and other acidic foods and beverages   Get your dental exam   If the sore is not gone in a month - call and leave a message

## 2018-04-07 NOTE — Progress Notes (Signed)
Subjective:    Patient ID: Mario Lawrence, male    DOB: [birth year], 47 y.o.   MRN: [id number]  HPI  Here for a sore on tongue   3-4 weeks  Had a sore on his tongue (thought it was a cold sore)  Hurt enough to affect speech  It never went away  Saw area on L side of tongue- small white patch there   No trauma known  occ bites tongue - possible  No dental work   No tobacco products (years) Quit smoking 10 y ago    Patient Active Problem List   Diagnosis Date Noted  . Tongue lesion 04/07/2018  . Elevated random blood glucose level 03/03/2018  . Social anxiety disorder 03/03/2018  . Prostate cancer screening 02/13/2017  . Colon cancer screening 08/19/2014  . Family history of colon cancer 05/15/2013  . Routine general medical examination at a health care facility 03/25/2011  . Seasonal allergies   . Hyperlipidemia 03/13/2010  . ERECTILE DYSFUNCTION 12/11/2009  . ASTHMA 12/11/2009  . GERD 12/11/2009   Past Medical History:  Diagnosis Date  . Asthma   . Dysphagia   . ED (erectile dysfunction)   . GERD (gastroesophageal reflux disease)   . Hyperlipidemia   . Seasonal allergies    History reviewed. No pertinent surgical history. Social History   Tobacco Use  . Smoking status: Former Smoker    Last attempt to quit: 12/06/2008    Years since quitting: 9.3  . Smokeless tobacco: Never Used  Substance Use Topics  . Alcohol use: Yes    Alcohol/week: 0.0 oz    Comment: Occasional-weekends  . Drug use: No   Family History  Problem Relation Age of Onset  . Ovarian cancer Maternal Grandmother   . Lung cancer Maternal Grandfather   . COPD Maternal Grandfather   . Colon cancer Father        in his 68s  . Ovarian cancer Paternal Grandmother    Allergies  Allergen Reactions  . Shrimp [Shellfish Allergy] Hives and Shortness Of Breath  . Codeine Nausea Only   Current Outpatient Medications on File Prior to Visit  Medication Sig Dispense Refill  . albuterol  (VENTOLIN HFA) 108 (90 BASE) MCG/ACT inhaler Inhale 2 puffs into the lungs every 4 (four) hours as needed. 1 Inhaler 5  . diphenhydrAMINE (BENADRYL) 25 MG tablet Take 2 tablets (50 mg total) by mouth every 4 (four) hours as needed for itching or allergies. 20 tablet 0  . EPINEPHrine (EPIPEN) 0.3 mg/0.3 mL IJ SOAJ injection Inject 0.3 mLs (0.3 mg total) into the muscle as needed. 1 Device 2  . fluticasone (FLONASE) 50 MCG/ACT nasal spray Place 2 sprays into both nostrils daily. 16 g 11  . loratadine (CLARITIN) 10 MG tablet Take 10 mg by mouth daily as needed for allergies.    . pantoprazole (PROTONIX) 40 MG tablet Take 1 tablet (40 mg total) by mouth 2 (two) times daily. 180 tablet 3  . sertraline (ZOLOFT) 50 MG tablet Take 1 tablet (50 mg total) by mouth daily. 30 tablet 1  . tadalafil (CIALIS) 20 MG tablet TAKE ONE TABLET BY MOUTH DAILY AS NEEDED FOR ERECTILE DYSFUNCTION 10 tablet 11   No current facility-administered medications on file prior to visit.     Review of Systems  Constitutional: Negative for activity change, appetite change, fatigue, fever and unexpected weight change.  HENT: Positive for mouth sores. Negative for congestion, dental problem, rhinorrhea, sore throat and trouble swallowing.  Eyes: Negative for pain, redness, itching and visual disturbance.  Respiratory: Negative for cough, chest tightness, shortness of breath and wheezing.   Cardiovascular: Negative for chest pain and palpitations.  Gastrointestinal: Negative for abdominal pain, blood in stool, constipation, diarrhea and nausea.  Endocrine: Negative for cold intolerance, heat intolerance, polydipsia and polyuria.  Genitourinary: Negative for difficulty urinating, dysuria, frequency and urgency.  Musculoskeletal: Negative for arthralgias, joint swelling and myalgias.  Skin: Negative for pallor and rash.  Neurological: Negative for dizziness, tremors, weakness, numbness and headaches.  Hematological: Negative for  adenopathy. Does not bruise/bleed easily.  Psychiatric/Behavioral: Negative for decreased concentration and dysphoric mood. The patient is not nervous/anxious.        Objective:   Physical Exam  Constitutional: He appears well-developed and well-nourished. No distress.  HENT:  Head: Normocephalic and atraumatic.  Nose: Nose normal.  Mouth/Throat: No oropharyngeal exudate.  2-3 mm white oval area on L lateral tongue that is tender to palpation No masses No other mouth lesions Mmm Fair dentition   Neck: Normal range of motion. Neck supple.  Lymphadenopathy:    He has no cervical adenopathy.  Skin: Skin is warm and dry. No rash noted. No erythema. No pallor.  Tanned   Psychiatric: He has a normal mood and affect.          Assessment & Plan:   Problem List Items Addressed This Visit      Other   Tongue lesion    Resembles small aphthous ulcer Will change toothpaste to baking soda and peroxide  Kenalog in orbase gel px  Refer to ENT in 1 mo if not resolved Avoid acidic foods/bev    Meds ordered this encounter  Medications  . triamcinolone (KENALOG) 0.1 % paste    Sig: Use as directed 1 application in the mouth or throat 2 (two) times daily. Dot on sore on tongue twice daily    Dispense:  5 g    Refill:  1

## 2018-04-09 NOTE — Assessment & Plan Note (Signed)
Resembles small aphthous ulcer Will change toothpaste to baking soda and peroxide  Kenalog in orbase gel px  Refer to ENT in 1 mo if not resolved Avoid acidic foods/bev    Meds ordered this encounter  Medications  . triamcinolone (KENALOG) 0.1 % paste    Sig: Use as directed 1 application in the mouth or throat 2 (two) times daily. Dot on sore on tongue twice daily    Dispense:  5 g    Refill:  1

## 2018-04-26 ENCOUNTER — Encounter: Payer: Self-pay | Admitting: Family Medicine

## 2018-04-26 ENCOUNTER — Ambulatory Visit: Payer: 59 | Admitting: Family Medicine

## 2018-04-26 VITALS — BP 122/88 | HR 60 | Temp 97.9°F | Ht 73.5 in | Wt 229.0 lb

## 2018-04-26 DIAGNOSIS — L03032 Cellulitis of left toe: Secondary | ICD-10-CM | POA: Diagnosis not present

## 2018-04-26 MED ORDER — AMOXICILLIN-POT CLAVULANATE 875-125 MG PO TABS: 1.0000 | ORAL_TABLET | Freq: Two times a day (BID) | ORAL | 0 refills | Status: DC

## 2018-04-26 NOTE — Patient Instructions (Signed)
Do warm soaks of foot daily If not better in 24-48 hours or if worse, start antiviotic  Paronychia Paronychia is an infection of the skin that surrounds a nail. It usually affects the skin around a fingernail, but it may also occur near a toenail. It often causes pain and swelling around the nail. This condition may come on suddenly or develop over a longer period. In some cases, a collection of pus (abscess) can form near or under the nail. Usually, paronychia is not serious and it clears up with treatment. What are the causes? This condition may be caused by bacteria or fungi. It is commonly caused by either Streptococcus or Staphylococcus bacteria. The bacteria or fungi often cause the infection by getting into the affected area through an opening in the skin, such as a cut or a hangnail. What increases the risk? This condition is more likely to develop in:  People who get their hands wet often, such as those who work as Fish farm manager, bartenders, or nurses.  People who bite their fingernails or suck their thumbs.  People who trim their nails too short.  People who have hangnails or injured fingertips.  People who get manicures.  People who have diabetes.  What are the signs or symptoms? Symptoms of this condition include:  Redness and swelling of the skin near the nail.  Tenderness around the nail when you touch the area.  Pus-filled bumps under the cuticle. The cuticle is the skin at the base or sides of the nail.  Fluid or pus under the nail.  Throbbing pain in the area.  How is this diagnosed? This condition is usually diagnosed with a physical exam. In some cases, a sample of pus may be taken from an abscess to be tested in a lab. This can help to determine what type of bacteria or fungi is causing the condition. How is this treated? Treatment for this condition depends on the cause and severity of the condition. If the condition is mild, it may clear up on its own in a  few days. Your health care provider may recommend soaking the affected area in warm water a few times a day. When treatment is needed, the options may include:  Antibiotic medicine, if the condition is caused by a bacterial infection.  Antifungal medicine, if the condition is caused by a fungal infection.  Incision and drainage, if an abscess is present. In this procedure, the health care provider will cut open the abscess so the pus can drain out.  Follow these instructions at home:  Soak the affected area in warm water if directed to do so by your health care provider. You may be told to do this for 20 minutes, 2-3 times a day. Keep the area dry in between soakings.  Take medicines only as directed by your health care provider.  If you were prescribed an antibiotic medicine, finish all of it even if you start to feel better.  Keep the affected area clean.  Do not try to drain a fluid-filled bump yourself.  If you will be washing dishes or performing other tasks that require your hands to get wet, wear rubber gloves. You should also wear gloves if your hands might come in contact with irritating substances, such as cleaners or chemicals.  Follow your health care provider's instructions about: ? Wound care. ? Bandage (dressing) changes and removal. Contact a health care provider if:  Your symptoms get worse or do not improve with treatment.  You  have a fever or chills.  You have redness spreading from the affected area.  You have continued or increased fluid, blood, or pus coming from the affected area.  Your finger or knuckle becomes swollen or is difficult to move. This information is not intended to replace advice given to you by your health care provider. Make sure you discuss any questions you have with your health care provider. Document Released: 05/18/2001 Document Revised: 04/29/2016 Document Reviewed: 10/30/2014 Elsevier Interactive Patient Education  [employer].

## 2018-04-26 NOTE — Progress Notes (Signed)
   Subjective:    Patient ID: Mario Lawrence, male    DOB: [birth year], 47 y.o.   MRN: [id number]  HPI This is a 47 yo male who presents today with left great toe pain x 2 weeks following an injury to end of toenail. Thinks he bumped it. Has been applying Neosporin. Feels a little better today after draining yesterday. He was pressing on it. He also has worn sandals today instead of closed toe shoes which has been significantly more comfortable.   Past Medical History:  Diagnosis Date  . Asthma   . Dysphagia   . ED (erectile dysfunction)   . GERD (gastroesophageal reflux disease)   . Hyperlipidemia   . Seasonal allergies    No past surgical history on file. Family History  Problem Relation Age of Onset  . Ovarian cancer Maternal Grandmother   . Lung cancer Maternal Grandfather   . COPD Maternal Grandfather   . Colon cancer Father        in his 65s  . Ovarian cancer Paternal Grandmother    Social History   Tobacco Use  . Smoking status: Former Smoker    Last attempt to quit: 12/06/2008    Years since quitting: 9.3  . Smokeless tobacco: Never Used  Substance Use Topics  . Alcohol use: Yes    Alcohol/week: 0.0 oz    Comment: Occasional-weekends  . Drug use: No      Review of Systems + pain, no fever, no chills    Objective:   Physical Exam  Constitutional: He is oriented to person, place, and time. He appears well-developed and well-nourished. No distress.  HENT:  Head: Normocephalic and atraumatic.  Eyes: Conjunctivae are normal.  Cardiovascular: Normal rate.  Pulmonary/Chest: Effort normal.  Neurological: He is alert and oriented to person, place, and time.  Skin: Skin is warm and dry. He is not diaphoretic.  Medial aspect of left great toe nail with slightly jagged nail, intact scab. Mildly erythematous, tender to palpation, unable to express any drainage.   Vitals reviewed.     BP 122/88 (BP Location: Left Arm, Patient Position: Sitting, Cuff Size:  Normal)   Pulse 60   Temp 97.9 F (36.6 C) (Oral)   Ht 6' 1.5" (1.867 m)   Wt 229 lb (103.9 kg)   SpO2 97%   BMI 29.80 kg/m  Wt Readings from Last 3 Encounters:  04/26/18 229 lb (103.9 kg)  04/07/18 230 lb (104.3 kg)  03/03/18 227 lb 8 oz (103.2 kg)       Assessment & Plan:  1. Paronychia of great toe of left foot - He has had some improvement over last 24 hours with changing shoes, this may resolve with warm soaks, keeping area clean and dry - he was provided information and a wait and see antibiotic if needed - RTC precautions reviewed - amoxicillin-clavulanate (AUGMENTIN) 875-125 MG tablet; Take 1 tablet by mouth 2 (two) times daily.  Dispense: 14 tablet; Refill: 0   Olean Ree, FNP-BC  [facility], [facility]  04/26/2018 4:07 PM

## 2018-05-05 ENCOUNTER — Ambulatory Visit: Payer: 59 | Admitting: Family Medicine

## 2018-06-01 ENCOUNTER — Encounter: Payer: Self-pay | Admitting: Family Medicine

## 2018-06-05 ENCOUNTER — Encounter: Payer: Self-pay | Admitting: Family Medicine

## 2018-06-05 ENCOUNTER — Ambulatory Visit: Payer: 59 | Admitting: Family Medicine

## 2018-06-05 VITALS — BP 118/70 | HR 60 | Temp 97.8°F | Ht 73.5 in | Wt 235.0 lb

## 2018-06-05 DIAGNOSIS — K148 Other diseases of tongue: Secondary | ICD-10-CM | POA: Diagnosis not present

## 2018-06-05 DIAGNOSIS — F4010 Social phobia, unspecified: Secondary | ICD-10-CM | POA: Diagnosis not present

## 2018-06-05 DIAGNOSIS — F418 Other specified anxiety disorders: Secondary | ICD-10-CM | POA: Diagnosis not present

## 2018-06-05 MED ORDER — PROPRANOLOL HCL 10 MG PO TABS: ORAL_TABLET | ORAL | 3 refills | Status: DC

## 2018-06-05 MED ORDER — SERTRALINE HCL 50 MG PO TABS: 50.0000 mg | ORAL_TABLET | Freq: Every day | ORAL | 11 refills | Status: DC

## 2018-06-05 NOTE — Assessment & Plan Note (Signed)
zoloft is helping daily social anx  Having trouble with stage fright -perf at church  Will try low dose propanolol 10-20 mg as tolerated before performances and update with response Will watch for dizziness/low pulse carefully

## 2018-06-05 NOTE — Progress Notes (Signed)
Subjective:    Patient ID: Mario Lawrence, male    DOB: 06/18/1971, 47 y.o.   MRN: [id number]  HPI  Here for re check of tongue lesion  Also to discuss psychiatric care  Seen in my for sore on tongue on L side  Px kenalog in orabase gel  Told to avoid acidic foods/beverages  No better - (pain improved with the gel)-then came back    Wt Readings from Last 3 Encounters:  06/05/18 235 lb (106.6 kg)  04/26/18 229 lb (103.9 kg)  04/07/18 230 lb (104.3 kg)   30.58 kg/m   Takes zoloft 50 mg daily  H/o social anx disorder  Was started by an on line psychiatrist  No counseling  It is working very well/ very happy with results (took a while to get used to it- made him sleepy and nauseated at work)- but much better now . Also sexual side effects are tolerable  No longer f/u with the tele med doctor   Can function better  Sleeps better  Appetite ok    Struggles with stage fright when performing at work  Otherwise day to day much better  Pulse Readings from Last 3 Encounters:  06/05/18 60  04/26/18 60  04/07/18 71    Patient Active Problem List   Diagnosis Date Noted  . Tongue lesion 04/07/2018  . Elevated random blood glucose level 03/03/2018  . Social anxiety disorder 03/03/2018  . Prostate cancer screening 02/13/2017  . Colon cancer screening 08/19/2014  . Family history of colon cancer 05/15/2013  . Routine general medical examination at a health care facility 03/25/2011  . Seasonal allergies   . Hyperlipidemia 03/13/2010  . ERECTILE DYSFUNCTION 12/11/2009  . ASTHMA 12/11/2009  . GERD 12/11/2009   Past Medical History:  Diagnosis Date  . Asthma   . Dysphagia   . ED (erectile dysfunction)   . GERD (gastroesophageal reflux disease)   . Hyperlipidemia   . Seasonal allergies    History reviewed. No pertinent surgical history. Social History   Tobacco Use  . Smoking status: Former Smoker    Last attempt to quit: 12/06/2008    Years since quitting: 9.5    . Smokeless tobacco: Never Used  Substance Use Topics  . Alcohol use: Yes    Alcohol/week: 0.0 oz    Comment: Occasional-weekends  . Drug use: No   Family History  Problem Relation Age of Onset  . Ovarian cancer Maternal Grandmother   . Lung cancer Maternal Grandfather   . COPD Maternal Grandfather   . Colon cancer Father        in his 20s  . Ovarian cancer Paternal Grandmother    Allergies  Allergen Reactions  . Shrimp [Shellfish Allergy] Hives and Shortness Of Breath  . Codeine Nausea Only   Current Outpatient Medications on File Prior to Visit  Medication Sig Dispense Refill  . albuterol (VENTOLIN HFA) 108 (90 BASE) MCG/ACT inhaler Inhale 2 puffs into the lungs every 4 (four) hours as needed. 1 Inhaler 5  . diphenhydrAMINE (BENADRYL) 25 MG tablet Take 2 tablets (50 mg total) by mouth every 4 (four) hours as needed for itching or allergies. 20 tablet 0  . EPINEPHrine (EPIPEN) 0.3 mg/0.3 mL IJ SOAJ injection Inject 0.3 mLs (0.3 mg total) into the muscle as needed. 1 Device 2  . fluticasone (FLONASE) 50 MCG/ACT nasal spray Place 2 sprays into both nostrils daily. 16 g 11  . loratadine (CLARITIN) 10 MG tablet Take 10 mg  by mouth daily as needed for allergies.    . pantoprazole (PROTONIX) 40 MG tablet Take 1 tablet (40 mg total) by mouth 2 (two) times daily. 180 tablet 3  . tadalafil (CIALIS) 20 MG tablet TAKE ONE TABLET BY MOUTH DAILY AS NEEDED FOR ERECTILE DYSFUNCTION 10 tablet 11  . triamcinolone (KENALOG) 0.1 % paste Use as directed 1 application in the mouth or throat 2 (two) times daily. Dot on sore on tongue twice daily 5 g 1   No current facility-administered medications on file prior to visit.     Review of Systems  Constitutional: Negative for activity change, appetite change, fatigue, fever and unexpected weight change.  HENT: Positive for mouth sores. Negative for congestion, rhinorrhea, sore throat and trouble swallowing.   Eyes: Negative for pain, redness, itching  and visual disturbance.  Respiratory: Negative for cough, chest tightness, shortness of breath and wheezing.   Cardiovascular: Negative for chest pain and palpitations.  Gastrointestinal: Negative for abdominal pain, blood in stool, constipation, diarrhea and nausea.  Endocrine: Negative for cold intolerance, heat intolerance, polydipsia and polyuria.  Genitourinary: Negative for difficulty urinating, dysuria, frequency and urgency.  Musculoskeletal: Negative for arthralgias, joint swelling and myalgias.  Skin: Negative for pallor and rash.  Neurological: Negative for dizziness, tremors, weakness, numbness and headaches.  Hematological: Negative for adenopathy. Does not bruise/bleed easily.  Psychiatric/Behavioral: Negative for decreased concentration, dysphoric mood and sleep disturbance. The patient is nervous/anxious.        Objective:   Physical Exam  Constitutional: He is oriented to person, place, and time. He appears well-developed and well-nourished. No distress.  Well appearing   HENT:  Head: Normocephalic and atraumatic.  Nose: Nose normal.  2-3 mm white superficial area /oval in shape on L lateral tongue No other similar lesions Not tender  No swelling or ulceration  Eyes: Pupils are equal, round, and reactive to light. Conjunctivae and EOM are normal. Right eye exhibits no discharge. Left eye exhibits no discharge. No scleral icterus.  Neck: Normal range of motion. Neck supple.  Cardiovascular: Normal rate, regular rhythm and normal heart sounds.  Pulmonary/Chest: Effort normal and breath sounds normal.  Lymphadenopathy:    He has no cervical adenopathy.  Neurological: He is alert and oriented to person, place, and time. No cranial nerve deficit.  Skin: Skin is warm and dry. No rash noted.  Psychiatric: He has a normal mood and affect.          Assessment & Plan:   Problem List Items Addressed This Visit      Other   Performance anxiety    zoloft is helping  daily social anx  Having trouble with stage fright -perf at church  Will try low dose propanolol 10-20 mg as tolerated before performances and update with response Will watch for dizziness/low pulse carefully      Relevant Medications   sertraline (ZOLOFT) 50 MG tablet   Social anxiety disorder - Primary    Doing well with sertraline 50 mg (with breakthrough stage fright for performances)  Will take this over from psychiatry since he is stable  Tolerating well and would like to stay on this dose  Reviewed stressors/ coping techniques/symptoms/ support sources/ tx options and side effects in detail today  Disc trial of beta blocker for stage fright        Relevant Medications   sertraline (ZOLOFT) 50 MG tablet   Tongue lesion    Persistent white lesion on L lateral tongue - briefly improved  with kenalog in orabase but not resolved Will ref to ENT for further eval       Relevant Orders   Ambulatory referral to ENT

## 2018-06-05 NOTE — Assessment & Plan Note (Signed)
Persistent white lesion on L lateral tongue - briefly improved with kenalog in orabase but not resolved Will ref to ENT for further eval

## 2018-06-05 NOTE — Patient Instructions (Signed)
Continue zoloft at current dose  Take care of yourself and keep exercising   Try the propranolol for stage fright 1-2 pills 30-60 minutes before performance  Do first dose at home to make sure no side effects (dizzy / feeling tired)   Let me know how it goes   We will refer you to ENT for the tongue lesion

## 2018-06-05 NOTE — Assessment & Plan Note (Signed)
Doing well with sertraline 50 mg (with breakthrough stage fright for performances)  Will take this over from psychiatry since he is stable  Tolerating well and would like to stay on this dose  Reviewed stressors/ coping techniques/symptoms/ support sources/ tx options and side effects in detail today  Disc trial of beta blocker for stage fright

## 2018-07-24 ENCOUNTER — Encounter: Payer: Self-pay | Admitting: Family Medicine

## 2018-07-26 MED ORDER — SERTRALINE HCL 50 MG PO TABS: 50.0000 mg | ORAL_TABLET | Freq: Every day | ORAL | 3 refills | Status: DC

## 2018-07-26 NOTE — Telephone Encounter (Signed)
zoloft px

## 2018-07-28 ENCOUNTER — Telehealth: Payer: Self-pay | Admitting: Family Medicine

## 2018-07-28 MED ORDER — CLONAZEPAM 0.25 MG PO TBDP: 0.2500 mg | ORAL_TABLET | Freq: Every day | ORAL | 0 refills | Status: DC | PRN

## 2018-07-28 NOTE — Telephone Encounter (Signed)
clonazepam

## 2018-09-22 ENCOUNTER — Encounter: Payer: Self-pay | Admitting: Family Medicine

## 2018-09-25 MED ORDER — CLONAZEPAM 0.5 MG PO TABS: 0.2500 mg | ORAL_TABLET | Freq: Every day | ORAL | 5 refills | Status: DC | PRN

## 2018-12-19 ENCOUNTER — Encounter: Payer: Self-pay | Admitting: Family Medicine

## 2018-12-20 MED ORDER — SERTRALINE HCL 50 MG PO TABS
ORAL_TABLET | ORAL | 3 refills | Status: DC
Start: 2018-12-20 — End: 2019-02-15

## 2019-02-15 ENCOUNTER — Encounter: Payer: Self-pay | Admitting: Family Medicine

## 2019-02-15 MED ORDER — SERTRALINE HCL 50 MG PO TABS: ORAL_TABLET | ORAL | 1 refills | Status: DC

## 2019-03-14 ENCOUNTER — Other Ambulatory Visit: Payer: Self-pay | Admitting: Family Medicine

## 2019-05-05 ENCOUNTER — Other Ambulatory Visit: Payer: Self-pay | Admitting: Family Medicine

## 2019-05-08 NOTE — Telephone Encounter (Signed)
Last filled on 09/25/18 #10 tabs with 5 refills, last OV was on 06/05/18 to discuss his psychiatrist and no future appts.

## 2019-06-19 ENCOUNTER — Other Ambulatory Visit: Payer: Self-pay | Admitting: Family Medicine

## 2019-06-19 NOTE — Telephone Encounter (Signed)
Last OV was 06/05/18 and no future appts., last filled on 05/08/19 #10 tabs with 0 refills

## 2019-08-11 ENCOUNTER — Other Ambulatory Visit: Payer: Self-pay | Admitting: Family Medicine

## 2019-08-14 NOTE — Telephone Encounter (Signed)
Last OV was 06/05/18 for an acute appt and no future appts., last filled on 06/19/19 #10 tabs with 0 refills

## 2019-08-23 ENCOUNTER — Other Ambulatory Visit: Payer: Self-pay | Admitting: Family Medicine

## 2019-08-23 NOTE — Telephone Encounter (Signed)
Pt hasn't been seen in over a year and no future appts., please advise  

## 2019-08-23 NOTE — Telephone Encounter (Signed)
Med refilled once and Carrie will reach out to pt to try and get appt scheduled  

## 2019-08-23 NOTE — Telephone Encounter (Signed)
Please schedule PE and refill until then  

## 2019-10-05 ENCOUNTER — Other Ambulatory Visit: Payer: Self-pay

## 2019-10-05 ENCOUNTER — Ambulatory Visit (INDEPENDENT_AMBULATORY_CARE_PROVIDER_SITE_OTHER): Payer: Self-pay | Admitting: Family Medicine

## 2019-10-05 ENCOUNTER — Encounter: Payer: Self-pay | Admitting: Family Medicine

## 2019-10-05 VITALS — BP 122/78 | HR 74 | Temp 96.7°F | Ht 73.5 in | Wt 238.2 lb

## 2019-10-05 DIAGNOSIS — F4010 Social phobia, unspecified: Secondary | ICD-10-CM

## 2019-10-05 DIAGNOSIS — Z0000 Encounter for general adult medical examination without abnormal findings: Secondary | ICD-10-CM

## 2019-10-05 DIAGNOSIS — Z1211 Encounter for screening for malignant neoplasm of colon: Secondary | ICD-10-CM

## 2019-10-05 DIAGNOSIS — R7309 Other abnormal glucose: Secondary | ICD-10-CM

## 2019-10-05 DIAGNOSIS — Z125 Encounter for screening for malignant neoplasm of prostate: Secondary | ICD-10-CM

## 2019-10-05 DIAGNOSIS — F418 Other specified anxiety disorders: Secondary | ICD-10-CM

## 2019-10-05 DIAGNOSIS — K219 Gastro-esophageal reflux disease without esophagitis: Secondary | ICD-10-CM

## 2019-10-05 DIAGNOSIS — E7800 Pure hypercholesterolemia, unspecified: Secondary | ICD-10-CM

## 2019-10-05 MED ORDER — TADALAFIL 20 MG PO TABS: ORAL_TABLET | ORAL | 11 refills | Status: DC

## 2019-10-05 MED ORDER — PANTOPRAZOLE SODIUM 40 MG PO TBEC: 40.0000 mg | DELAYED_RELEASE_TABLET | Freq: Two times a day (BID) | ORAL | 0 refills | Status: DC

## 2019-10-05 MED ORDER — SERTRALINE HCL 50 MG PO TABS: ORAL_TABLET | ORAL | 1 refills | Status: DC

## 2019-10-05 NOTE — Progress Notes (Signed)
Subjective:    Patient ID: Mario Lawrence, male    DOB: 1971-06-27, 48 y.o.   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 [employer] Very challenging  Loves it   [facility] Readings from Last 3 Encounters:  10/05/19 238 lb 3 oz (108 kg)  06/05/18 235 lb (106.6 kg)  04/26/18 229 lb (103.9 kg)  eating healthy for the most part  31.00 kg/m   Flu shot -does not get   Tdap 3/17   Prostate health  No urinary changes at all  Nl flow and stream No nocturia  No fam hx of prostate cancer    GERD-takes protonix Works very well  Still has to watch diet as well   Colon cancer screening  Father had colon cancer at age 62  Formerly his ins would not pay for colonoscopy until 59 Also would not cover cologuard ifob kit in the past -did not do   He changed jobs recently [employer] at all  Is planning on signing up for it soon  Also he may have ins with job in [place] H/o elevated glucose in the past   Lab Results  Component Value Date   PSA 0.22 02/24/2018   PSA 0.18 02/18/2017     Also hyperlipidemia Lab Results  Component Value Date   CHOL 191 02/24/2018   HDL 43.30 02/24/2018   LDLCALC 122 (H) 02/24/2018   LDLDIRECT 153.7 12/11/2009   TRIG 125.0 02/24/2018   CHOLHDL 4 02/24/2018   Takes zoloft for social anxiety  proprnolol for performance anxiety did not work Uses klonopin prn   Patient Active Problem List   Diagnosis Date Noted  . Performance anxiety 06/05/2018  . Tongue lesion 04/07/2018  . Elevated random blood glucose level 03/03/2018  . Social anxiety disorder 03/03/2018  . Prostate cancer screening 02/13/2017  . Colon cancer screening 08/19/2014  . Family history of colon cancer 05/15/2013  . Routine general medical examination at a health care facility 03/25/2011  . Seasonal  allergies   . Hyperlipidemia 03/13/2010  . ERECTILE DYSFUNCTION 12/11/2009  . ASTHMA 12/11/2009  . GERD 12/11/2009   Past Medical History:  Diagnosis Date  . Asthma   . Dysphagia   . ED (erectile dysfunction)   . GERD (gastroesophageal reflux disease)   . Hyperlipidemia   . Seasonal allergies    No past surgical history on file. Social History   Tobacco Use  . Smoking status: Former Smoker    Quit date: 12/06/2008    Years since quitting: 10.8  . Smokeless tobacco: Never Used  Substance Use Topics  . Alcohol use: Yes    Alcohol/week: 0.0 standard drinks    Comment: Occasional-weekends  . Drug use: No   Family History  Problem Relation Age of Onset  . Ovarian cancer Maternal Grandmother   . Lung cancer Maternal Grandfather   . COPD Maternal Grandfather   . Colon cancer Father        in his 28s  . Ovarian cancer Paternal Grandmother    Allergies  Allergen Reactions  . Shrimp [Shellfish Allergy] Hives and Shortness Of Breath  . Codeine Nausea Only   Current Outpatient Medications on File Prior to Visit  Medication Sig Dispense Refill  . albuterol (VENTOLIN HFA)  108 (90 BASE) MCG/ACT inhaler Inhale 2 puffs into the lungs every 4 (four) hours as needed. 1 Inhaler 5  . clonazePAM (KLONOPIN) 0.5 MG tablet TAKE 1/2 (ONE-HALF) TABLET BY MOUTH ONCE DAILY AS NEEDED FOR ANXIETY (PUBLIC SPEAKING) 10 tablet 0  . diphenhydrAMINE (BENADRYL) 25 MG tablet Take 2 tablets (50 mg total) by mouth every 4 (four) hours as needed for itching or allergies. 20 tablet 0  . EPINEPHrine (EPIPEN) 0.3 mg/0.3 mL IJ SOAJ injection Inject 0.3 mLs (0.3 mg total) into the muscle as needed. 1 Device 2  . loratadine (CLARITIN) 10 MG tablet Take 10 mg by mouth daily as needed for allergies.    Marland Kitchen triamcinolone (KENALOG) 0.1 % paste Use as directed 1 application in the mouth or throat 2 (two) times daily. Dot on sore on tongue twice daily 5 g 1  . fluticasone (FLONASE) 50 MCG/ACT nasal spray Place 2 sprays  into both nostrils daily. 16 g 11   No current facility-administered medications on file prior to visit.      Review of Systems  Constitutional: Negative for activity change, appetite change, fatigue, fever and unexpected weight change.  HENT: Negative for congestion, rhinorrhea, sore throat and trouble swallowing.   Eyes: Negative for pain, redness, itching and visual disturbance.  Respiratory: Negative for cough, chest tightness, shortness of breath and wheezing.   Cardiovascular: Negative for chest pain and palpitations.  Gastrointestinal: Negative for abdominal pain, blood in stool, constipation, diarrhea and nausea.  Endocrine: Negative for cold intolerance, heat intolerance, polydipsia and polyuria.  Genitourinary: Negative for difficulty urinating, dysuria, frequency and urgency.  Musculoskeletal: Negative for arthralgias, joint swelling and myalgias.  Skin: Negative for pallor and rash.  Neurological: Negative for dizziness, tremors, weakness, numbness and headaches.  Hematological: Negative for adenopathy. Does not bruise/bleed easily.  Psychiatric/Behavioral: Negative for decreased concentration and dysphoric mood. The patient is not nervous/anxious.        Objective:   Physical Exam Constitutional:      General: He is not in acute distress.    Appearance: Normal appearance. He is well-developed. He is obese. He is not ill-appearing or diaphoretic.     Comments: bmi is in obesity range but pt has a very muscular build   HENT:     Head: Normocephalic and atraumatic.     Right Ear: Tympanic membrane, ear canal and external ear normal.     Left Ear: Tympanic membrane, ear canal and external ear normal.     Nose: Nose normal. No congestion.     Mouth/Throat:     Mouth: Mucous membranes are moist.     Pharynx: Oropharynx is clear. No posterior oropharyngeal erythema.  Eyes:     General: No scleral icterus.       Right eye: No discharge.        Left eye: No discharge.      Conjunctiva/sclera: Conjunctivae normal.     Pupils: Pupils are equal, round, and reactive to light.  Neck:     Musculoskeletal: Normal range of motion and neck supple. No neck rigidity or muscular tenderness.     Thyroid: No thyromegaly.     Vascular: No carotid bruit or JVD.  Cardiovascular:     Rate and Rhythm: Normal rate and regular rhythm.     Pulses: Normal pulses.     Heart sounds: Normal heart sounds. No gallop.   Pulmonary:     Effort: Pulmonary effort is normal. No respiratory distress.     Breath sounds:  Normal breath sounds. No wheezing or rales.     Comments: Good air exch Chest:     Chest wall: No tenderness.  Abdominal:     General: Bowel sounds are normal. There is no distension or abdominal bruit.     Palpations: Abdomen is soft. There is no mass.     Tenderness: There is no abdominal tenderness.     Hernia: No hernia is present.  Musculoskeletal:        General: No tenderness.     Right lower leg: No edema.     Left lower leg: No edema.  Lymphadenopathy:     Cervical: No cervical adenopathy.  Skin:    General: Skin is warm and dry.     Coloration: Skin is not pale.     Findings: No erythema or rash.     Comments: Tanned Some lentigines  Neurological:     Mental Status: He is alert.     Cranial Nerves: No cranial nerve deficit.     Motor: No abnormal muscle tone.     Coordination: Coordination normal.     Gait: Gait normal.     Deep Tendon Reflexes: Reflexes are normal and symmetric. Reflexes normal.  Psychiatric:        Mood and Affect: Mood normal. Mood is not anxious.        Cognition and Memory: Cognition and memory normal.           Assessment & Plan:   Problem List Items Addressed This Visit      Digestive   GERD (Chronic)    Continues protonix Symptom free if he takes this and watches diet for triggers      Relevant Medications   pantoprazole (PROTONIX) 40 MG tablet     Other   Hyperlipidemia    Disc goals for lipids and  reasons to control them Rev last labs with pt Rev low sat fat diet in detail  Due for labs today  Diet is fairly good       Relevant Medications   tadalafil (CIALIS) 20 MG tablet   Other Relevant Orders   Comprehensive metabolic panel (Completed)   Lipid panel (Completed)   Routine general medical examination at a health care facility - Primary    Reviewed health habits including diet and exercise and skin cancer prevention Reviewed appropriate screening tests for age  Also reviewed health mt list, fam hx and immunization status , as well as social and family history   See HPI Labs ordered  Pt declines flu shot- disc this with him and asked to re consider  Looking into affordability of colonoscopy given his family hx         Relevant Orders   CBC with Differential/Platelet (Completed)   Comprehensive metabolic panel (Completed)   Lipid panel (Completed)   TSH (Completed)   Colon cancer screening    Pt's father had colon cancer at age 35  Previously pt's ins did not cover colonoscopy before age 78  Now the age for screening has been moved to 39 anyway He will check on this when he gets insurance (soon) and let us know  He will do ifob kit if not covered/or cologuard if affordable      Prostate cancer screening    psa normal in the past  No symptoms or fam hx  Will resume at age 63       Elevated random blood glucose level    Lab Results  Component Value  Date   HGBA1C 5.1 10/05/2019   disc imp of low glycemic diet and wt loss to prevent DM2       Relevant Orders   Hemoglobin A1c (Completed)   Social anxiety disorder    Sertraline works well for him  Reviewed stressors/ coping techniques/symptoms/ support sources/ tx options and side effects in detail today Good coping tech Also exercise habits      Relevant Medications   sertraline (ZOLOFT) 50 MG tablet   Performance anxiety    Uses klonopin rarely for public speaking/performance  In the past beta  blocker did not work out      Relevant Medications   sertraline (ZOLOFT) 50 MG tablet

## 2019-10-05 NOTE — Patient Instructions (Addendum)
Please consider getting the flu shot    You need a colonoscopy in light of age and family history Call us when you are ready to schedule a colonoscopy  If that is not possible we can can do a stool kit   Labs today   Take care ov yourself Keep up the exercise

## 2019-10-06 LAB — CBC WITH DIFFERENTIAL/PLATELET
Absolute Monocytes: 719 cells/uL (ref 200–950)
Basophils Absolute: 70 cells/uL (ref 0–200)
Basophils Relative: 1.2 %
Eosinophils Absolute: 371 cells/uL (ref 15–500)
Eosinophils Relative: 6.4 %
HCT: 44.5 % (ref 38.5–50.0)
Hemoglobin: 15.8 g/dL (ref 13.2–17.1)
Lymphs Abs: 1682 cells/uL (ref 850–3900)
MCH: 32.4 pg (ref 27.0–33.0)
MCHC: 35.5 g/dL (ref 32.0–36.0)
MCV: 91.4 fL (ref 80.0–100.0)
MPV: 10.5 fL (ref 7.5–12.5)
Monocytes Relative: 12.4 %
Neutro Abs: 2958 cells/uL (ref 1500–7800)
Neutrophils Relative %: 51 %
Platelets: 282 10*3/uL (ref 140–400)
RBC: 4.87 10*6/uL (ref 4.20–5.80)
RDW: 12.8 % (ref 11.0–15.0)
Total Lymphocyte: 29 %
WBC: 5.8 10*3/uL (ref 3.8–10.8)

## 2019-10-06 LAB — LIPID PANEL
Cholesterol: 190 mg/dL (ref <200)
HDL: 35 mg/dL — ABNORMAL LOW (ref >=40)
LDL Cholesterol (Calc): 114 mg/dL (calc) — ABNORMAL HIGH
Non-HDL Cholesterol (Calc): 155 mg/dL (calc) — ABNORMAL HIGH (ref <130)
Total CHOL/HDL Ratio: 5.4 (calc) — ABNORMAL HIGH (ref <5.0)
Triglycerides: 304 mg/dL — ABNORMAL HIGH (ref <150)

## 2019-10-06 LAB — COMPREHENSIVE METABOLIC PANEL
AG Ratio: 1.7 (calc) (ref 1.0–2.5)
ALT: 20 U/L (ref 9–46)
AST: 17 U/L (ref 10–40)
Albumin: 4.3 g/dL (ref 3.6–5.1)
Alkaline phosphatase (APISO): 65 U/L (ref 36–130)
BUN: 14 mg/dL (ref 7–25)
CO2: 27 mmol/L (ref 20–32)
Calcium: 9.2 mg/dL (ref 8.6–10.3)
Chloride: 102 mmol/L (ref 98–110)
Creat: 1.33 mg/dL (ref 0.60–1.35)
Globulin: 2.6 g/dL (calc) (ref 1.9–3.7)
Glucose, Bld: 76 mg/dL (ref 65–99)
Potassium: 4.2 mmol/L (ref 3.5–5.3)
Sodium: 139 mmol/L (ref 135–146)
Total Bilirubin: 0.7 mg/dL (ref 0.2–1.2)
Total Protein: 6.9 g/dL (ref 6.1–8.1)

## 2019-10-06 LAB — TSH: TSH: 1.54 mIU/L (ref 0.40–4.50)

## 2019-10-06 LAB — HEMOGLOBIN A1C
Hgb A1c MFr Bld: 5.1 % of total Hgb (ref <5.7)
Mean Plasma Glucose: 100 (calc)
eAG (mmol/L): 5.5 (calc)

## 2019-10-06 NOTE — Assessment & Plan Note (Signed)
Lab Results  Component Value Date   HGBA1C 5.1 10/05/2019   disc imp of low glycemic diet and wt loss to prevent DM2

## 2019-10-06 NOTE — Assessment & Plan Note (Signed)
Uses klonopin rarely for public speaking/performance  In the past beta blocker did not work out

## 2019-10-06 NOTE — Assessment & Plan Note (Signed)
psa normal in the past  No symptoms or fam hx  Will resume at age 48

## 2019-10-06 NOTE — Assessment & Plan Note (Signed)
Sertraline works well for him  Reviewed stressors/ coping techniques/symptoms/ support sources/ tx options and side effects in detail today Good coping tech Also exercise habits

## 2019-10-06 NOTE — Assessment & Plan Note (Signed)
Disc goals for lipids and reasons to control them Rev last labs with pt Rev low sat fat diet in detail  Due for labs today  Diet is fairly good

## 2019-10-06 NOTE — Assessment & Plan Note (Signed)
Continues protonix Symptom free if he takes this and watches diet for triggers

## 2019-10-06 NOTE — Assessment & Plan Note (Signed)
Pt's father had colon cancer at age 46  Previously pt's ins did not cover colonoscopy before age 50  Now the age for screening has been moved to 45 anyway He will check on this when he gets insurance (soon) and let us know  He will do ifob kit if not covered/or cologuard if affordable 

## 2019-10-06 NOTE — Assessment & Plan Note (Signed)
Reviewed health habits including diet and exercise and skin cancer prevention Reviewed appropriate screening tests for age  Also reviewed health mt list, fam hx and immunization status , as well as social and family history   See HPI Labs ordered  Pt declines flu shot- disc this with him and asked to re consider  Looking into affordability of colonoscopy given his family hx

## 2019-10-08 ENCOUNTER — Encounter: Payer: Self-pay | Admitting: *Deleted

## 2019-11-14 ENCOUNTER — Other Ambulatory Visit: Payer: Self-pay | Admitting: Family Medicine

## 2019-11-14 NOTE — Telephone Encounter (Signed)
Name of Medication: [place] Name of Pharmacy: [facility] Last Fill or Written Date and Quantity: 08/14/19 #10 tabs with 0 refills Last Office Visit and Type: CPE on 10/05/19 Next Office Visit and Type: none scheduled Last Controlled Substance Agreement Date: n/a Last UDS:n/a

## 2020-01-23 ENCOUNTER — Other Ambulatory Visit: Payer: Self-pay | Admitting: Family Medicine

## 2020-01-23 NOTE — Telephone Encounter (Signed)
Name of Medication: Klonopin Name of Pharmacy: [facility] [address] Last Fill or Written Date and Quantity:11/14/19 #10 tabs with 0 refills Last Office Visit and Type: CPE on 10/05/19 Next Office Visit and Type: none scheduled Last Controlled Substance Agreement Date: n/a Last UDS:n/a

## 2020-04-12 ENCOUNTER — Other Ambulatory Visit: Payer: Self-pay | Admitting: Family Medicine

## 2020-04-14 NOTE — Telephone Encounter (Signed)
Name of Medication:Klonopin Name of Pharmacy:[facility] [address] Last Fill or Written Date and Quantity:01/23/20 #10 tabs with 0 refills Last Office Visit and Type:CPE on 10/05/19 Next Office Visit and Type:none scheduled Last Controlled Substance Agreement Date:n/a Last UDS:n/a

## 2020-04-17 ENCOUNTER — Other Ambulatory Visit: Payer: Self-pay | Admitting: Family Medicine

## 2020-04-25 ENCOUNTER — Other Ambulatory Visit: Payer: Self-pay | Admitting: Family Medicine

## 2020-04-25 ENCOUNTER — Encounter: Payer: Self-pay | Admitting: Family Medicine

## 2020-04-30 ENCOUNTER — Encounter: Payer: Self-pay | Admitting: Family Medicine

## 2020-04-30 MED ORDER — SERTRALINE HCL 50 MG PO TABS: ORAL_TABLET | ORAL | 2 refills | Status: DC

## 2020-04-30 NOTE — Telephone Encounter (Signed)
Last refilled 10/04/2020,  #45 x 1 refill  Please advise, thanks.

## 2020-05-20 ENCOUNTER — Other Ambulatory Visit: Payer: Self-pay | Admitting: *Deleted

## 2020-05-20 MED ORDER — PANTOPRAZOLE SODIUM 40 MG PO TBEC: 40.0000 mg | DELAYED_RELEASE_TABLET | Freq: Two times a day (BID) | ORAL | 1 refills | Status: DC

## 2020-06-06 MED ORDER — SERTRALINE HCL 50 MG PO TABS: ORAL_TABLET | ORAL | 3 refills | Status: DC

## 2020-06-06 NOTE — Addendum Note (Signed)
Addended by: Roxy Manns A on: 06/06/2020 08:38 AM   Modules accepted: Orders

## 2020-06-23 ENCOUNTER — Encounter: Payer: Self-pay | Admitting: Family Medicine

## 2020-06-29 MED ORDER — BUSPIRONE HCL 15 MG PO TABS: 15.0000 mg | ORAL_TABLET | Freq: Two times a day (BID) | ORAL | 5 refills | Status: DC

## 2020-07-15 ENCOUNTER — Other Ambulatory Visit: Payer: Self-pay | Admitting: Family Medicine

## 2020-07-16 NOTE — Telephone Encounter (Signed)
Name of Medication:Klonopin Name of Pharmacy:[facility] [address] Last Fill or Written Date and Quantity:04/14/20 #10 tabs with 0 refills Last Office Visit and Type:CPE on 10/05/19 Next Office Visit and Type:none scheduled Last Controlled Substance Agreement Date:n/a Last UDS:n/a

## 2020-10-20 ENCOUNTER — Telehealth: Payer: Self-pay | Admitting: Family Medicine

## 2020-10-20 NOTE — Telephone Encounter (Signed)
  I spoke with Mario Lawrence  Pt  Needs to have virtual appointment prior to getting covid test done here.    Spouse is aware and she stated they are going to try going to go [facility],  She stated pt was having fever chills chest congestion cough and body aches.  She stated pt was not having sob or difficulty breathing.   She wanted to know how long it would take to get results back. She is aware it would take 24-36 hours to get results

## 2020-10-20 NOTE — Telephone Encounter (Signed)
Pt wife called in wanted to know about getting a covid test done here.

## 2020-10-21 DIAGNOSIS — Z8616 Personal history of COVID-19: Secondary | ICD-10-CM

## 2020-10-21 HISTORY — DX: Personal history of COVID-19: Z86.16

## 2020-10-21 NOTE — Telephone Encounter (Signed)
I left a message on patient's voice mail to return my call.  Please schedule virtual visit.

## 2020-10-21 NOTE — Telephone Encounter (Signed)
Please schedule a virtual visit if needed/ sounds like they went somewhere else

## 2020-10-29 ENCOUNTER — Encounter: Payer: Self-pay | Admitting: Family Medicine

## 2020-10-29 ENCOUNTER — Telehealth (INDEPENDENT_AMBULATORY_CARE_PROVIDER_SITE_OTHER): Payer: 59 | Admitting: Family Medicine

## 2020-10-29 ENCOUNTER — Other Ambulatory Visit: Payer: Self-pay

## 2020-10-29 DIAGNOSIS — U071 COVID-19: Secondary | ICD-10-CM | POA: Diagnosis not present

## 2020-10-29 MED ORDER — PREDNISONE 10 MG PO TABS: ORAL_TABLET | ORAL | 0 refills | Status: DC

## 2020-10-29 MED ORDER — ALBUTEROL SULFATE HFA 108 (90 BASE) MCG/ACT IN AERS: 2.0000 | INHALATION_SPRAY | RESPIRATORY_TRACT | 3 refills | Status: AC | PRN

## 2020-10-29 NOTE — Progress Notes (Signed)
Virtual Visit via Video Note  I connected with Mario Lawrence on 10/29/20 at 10:00 AM EST by a video enabled telemedicine application and verified that I am speaking with the correct person using two identifiers.  Location: Patient: home Provider: office   I discussed the limitations of evaluation and management by telemedicine and the availability of in person appointments. The patient expressed understanding and agreed to proceed.  Parties involved in encounter  Patient: Mario Lawrence  Provider:  Roxy Manns MD    History of Present Illness: Pt presents with covid 19 symptoms   Started symptoms on the 14th  Had rapid and pcr test pos on 16th   His symptoms have improved overall  Very fatigued   Now more congestion in his chest  Cough gets worse in the evenings and a little more sob  Tries to increase activity and then coughing fits get worse  No fever  A little nasal congestion  No sinus pain   Phlegm is grey /yellow in color  He has some wheezing (not bad and not tight feeling)  He is using albuterol inhaler -helps with wheezing   Not immunized for covid   otc Tylenol  Zinc and vit D and C  He did take nyquil initially    he has a h/o asthma and is a former smoker   Patient Active Problem List   Diagnosis Date Noted  . COVID-19 10/29/2020  . Performance anxiety 06/05/2018  . Tongue lesion 04/07/2018  . Elevated random blood glucose level 03/03/2018  . Social anxiety disorder 03/03/2018  . Prostate cancer screening 02/13/2017  . Colon cancer screening 08/19/2014  . Family history of colon cancer 05/15/2013  . Routine general medical examination at a health care facility 03/25/2011  . Seasonal allergies   . Hyperlipidemia 03/13/2010  . ERECTILE DYSFUNCTION 12/11/2009  . ASTHMA 12/11/2009  . GERD 12/11/2009   Past Medical History:  Diagnosis Date  . Asthma   . Dysphagia   . ED (erectile dysfunction)   . GERD (gastroesophageal reflux  disease)   . Hyperlipidemia   . Seasonal allergies    History reviewed. No pertinent surgical history. Social History   Tobacco Use  . Smoking status: Former Smoker    Quit date: 12/06/2008    Years since quitting: 11.9  . Smokeless tobacco: Never Used  Substance Use Topics  . Alcohol use: Yes    Alcohol/week: 0.0 standard drinks    Comment: Occasional-weekends  . Drug use: No   Family History  Problem Relation Age of Onset  . Ovarian cancer Maternal Grandmother   . Lung cancer Maternal Grandfather   . COPD Maternal Grandfather   . Colon cancer Father        in his 30s  . Ovarian cancer Paternal Grandmother    Allergies  Allergen Reactions  . Shrimp [Shellfish Allergy] Hives and Shortness Of Breath  . Codeine Nausea Only   Current Outpatient Medications on File Prior to Visit  Medication Sig Dispense Refill  . busPIRone (BUSPAR) 15 MG tablet Take 1 tablet (15 mg total) by mouth 2 (two) times daily. 60 tablet 5  . clonazePAM (KLONOPIN) 0.5 MG tablet TAKE 1/2 (ONE-HALF) TABLET BY MOUTH ONCE DAILY AS NEEDED FOR ANXIETY (PUBLIC  SPEAKING) 10 tablet 0  . diphenhydrAMINE (BENADRYL) 25 MG tablet Take 2 tablets (50 mg total) by mouth every 4 (four) hours as needed for itching or allergies. 20 tablet 0  . EPINEPHrine (EPIPEN) 0.3 mg/0.3 mL IJ SOAJ  injection Inject 0.3 mLs (0.3 mg total) into the muscle as needed. 1 Device 2  . loratadine (CLARITIN) 10 MG tablet Take 10 mg by mouth daily as needed for allergies.    . pantoprazole (PROTONIX) 40 MG tablet Take 1 tablet (40 mg total) by mouth 2 (two) times daily. 180 tablet 1  . tadalafil (CIALIS) 20 MG tablet TAKE ONE TABLET BY MOUTH DAILY AS NEEDED FOR ERECTILE DYSFUNCTION 10 tablet 11  . triamcinolone (KENALOG) 0.1 % paste Use as directed 1 application in the mouth or throat 2 (two) times daily. Dot on sore on tongue twice daily 5 g 1  . fluticasone (FLONASE) 50 MCG/ACT nasal spray Place 2 sprays into both nostrils daily. 16 g 11   No  current facility-administered medications on file prior to visit.    Review of Systems  Constitutional: Negative for chills, fever and malaise/fatigue.       Some fatigue   HENT: Negative for congestion, ear pain, sinus pain and sore throat.   Eyes: Negative for blurred vision, discharge and redness.  Respiratory: Positive for cough, sputum production and wheezing. Negative for shortness of breath and stridor.   Cardiovascular: Negative for chest pain, palpitations and leg swelling.  Gastrointestinal: Negative for abdominal pain, diarrhea, nausea and vomiting.  Musculoskeletal: Negative for myalgias.  Skin: Negative for rash.  Neurological: Negative for dizziness and headaches.     Observations/Objective: Patient appears well, in no distress Weight is baseline  No facial swelling or asymmetry Normal voice-not hoarse and no slurred speech No obvious tremor or mobility impairment Moving neck and UEs normally Able to hear the call well  Junky sounding cough heard (no wheezing or sob noted during interview) Talkative and mentally sharp with no cognitive changes No skin changes on face or neck , no rash or pallor Affect is normal    Assessment and Plan: Problem List Items Addressed This Visit      Other   COVID-19    With cough and congestion  Out of the window for antibody infusion  Symptoms improving but mild wheeze and some cough  Recommend fluids and rest Expectorant with DM Albuterol mdi prn  Sent in prednisone 30 mg taper and disc poss side eff Update if not starting to improve in a week or if worsening   inst to go to ER if suddenly worse or sob            Follow Up Instructions: Take prednisone as directed  mucinex or robitussin dm for cough  Use albuterol as needed   Watch for worse cough or shortness of breath or return of elevated temperature  If short of breath go to there ER  Update if not starting to improve in a week or if worsening     I discussed  the assessment and treatment plan with the patient. The patient was provided an opportunity to ask questions and all were answered. The patient agreed with the plan and demonstrated an understanding of the instructions.   The patient was advised to call back or seek an in-person evaluation if the symptoms worsen or if the condition fails to improve as anticipated.     Roxy Manns, MD

## 2020-10-29 NOTE — Assessment & Plan Note (Signed)
With cough and congestion  Out of the window for antibody infusion  Symptoms improving but mild wheeze and some cough  Recommend fluids and rest Expectorant with DM Albuterol mdi prn  Sent in prednisone 30 mg taper and disc poss side eff Update if not starting to improve in a week or if worsening   inst to go to ER if suddenly worse or sob

## 2020-11-01 NOTE — Patient Instructions (Addendum)
Take prednisone as directed  mucinex or robitussin dm for cough  Use albuterol as needed   Watch for worse cough or shortness of breath or return of elevated temperature  If short of breath go to there ER  Update if not starting to improve in a week or if worsening

## 2020-11-09 ENCOUNTER — Telehealth: Payer: Self-pay | Admitting: Family Medicine

## 2020-11-09 DIAGNOSIS — Z0000 Encounter for general adult medical examination without abnormal findings: Secondary | ICD-10-CM

## 2020-11-09 DIAGNOSIS — E7800 Pure hypercholesterolemia, unspecified: Secondary | ICD-10-CM

## 2020-11-09 DIAGNOSIS — R7309 Other abnormal glucose: Secondary | ICD-10-CM

## 2020-11-09 NOTE — Telephone Encounter (Signed)
-----   Message from Aquilla Solian, RT sent at 10/28/2020  7:55 AM EST ----- Regarding: Lab Orders for Monday 12.6.2021 Please place lab orders for Monday 12.6.2021, office visit for physical on Monday 12.13.2021 Thank you, Jones Bales RT(R)

## 2020-11-10 ENCOUNTER — Other Ambulatory Visit: Payer: Self-pay

## 2020-11-17 ENCOUNTER — Encounter: Payer: Self-pay | Admitting: Family Medicine

## 2020-11-25 ENCOUNTER — Encounter (HOSPITAL_BASED_OUTPATIENT_CLINIC_OR_DEPARTMENT_OTHER): Payer: Self-pay | Admitting: Orthopedic Surgery

## 2020-11-25 ENCOUNTER — Other Ambulatory Visit: Payer: Self-pay

## 2020-11-27 ENCOUNTER — Ambulatory Visit: Payer: Self-pay | Admitting: Physician Assistant

## 2020-11-27 NOTE — H&P (View-Only) (Signed)
Mario Lawrence is an 49 y.o. male.   Chief Complaint: right distal biceps tear  HPI: Mario Lawrence is a 49 year old male who injured his right elbow about two weeks ago, distal biceps. The family elected to obtain an MRI which showed a high grade near complete tear of the distal biceps tendon. He states the pain has actually improved somewhat over the past couple of weeks. He has been careful not to use or lift the  arm much. It is in more of a comfortable position with the arm flexed. He is here for treatment recommendations today.  Past Medical History:  Diagnosis Date  . Anxiety   . Asthma   . Dysphagia   . ED (erectile dysfunction)   . GERD (gastroesophageal reflux disease)   . History of COVID-19 10/21/2020  . Hyperlipidemia   . Seasonal allergies     No past surgical history on file.  Family History  Problem Relation Age of Onset  . Ovarian cancer Maternal Grandmother   . Lung cancer Maternal Grandfather   . COPD Maternal Grandfather   . Colon cancer Father        in his 50s  . Ovarian cancer Paternal Grandmother    Social History:  reports that he quit smoking about 11 years ago. He has never used smokeless tobacco. He reports current alcohol use. He reports that he does not use drugs.  Allergies:  Allergies  Allergen Reactions  . Shrimp [Shellfish Allergy] Hives and Shortness Of Breath  . Codeine Nausea Only  . Tramadol Other (See Comments)    Does likes like how it makes him feel    (Not in a hospital admission)   No results found for this or any previous visit (from the past 48 hour(s)). No results found.  Review of Systems  Constitutional: Positive for fever.  Musculoskeletal: Positive for arthralgias.  All other systems reviewed and are negative.   There were no vitals taken for this visit. Physical Exam Constitutional:      General: He is not in acute distress.    Appearance: Normal appearance.  HENT:     Head: Normocephalic and atraumatic.   Eyes:     Extraocular Movements: Extraocular movements intact.     Pupils: Pupils are equal, round, and reactive to light.  Cardiovascular:     Rate and Rhythm: Normal rate.     Pulses: Normal pulses.  Pulmonary:     Effort: Pulmonary effort is normal. No respiratory distress.  Abdominal:     General: Abdomen is flat. There is no distension.  Musculoskeletal:     Cervical back: Normal range of motion.     Comments: On examination of the right upper extremity he is neurovascularly intact. He does have a palpable biceps tendon at the elbow  crease. He has minimal tenderness to palpation. Mild discomfort through passive resisted supination of the wrist.   Lymphadenopathy:     Cervical: No cervical adenopathy.  Skin:    General: Skin is warm and dry.     Findings: No erythema or rash.  Neurological:     General: No focal deficit present.     Mental Status: He is alert and oriented to person, place, and time.  Psychiatric:        Mood and Affect: Mood normal.        Behavior: Behavior normal.      Assessment/Plan Right distal biceps high grade partial tear.   Initially elected to monitor nonoperative treatment   but upon return visit and further discussion with dr caffrey in this active 49yo with near complete distal biceps tear did discuss risks and benefits of repair and he wishes to proceed.    Margart Sickles, PA-C 11/27/2020, 7:05 PM

## 2020-11-27 NOTE — H&P (Signed)
Mario Lawrence is an 49 y.o. male.   Chief Complaint: right distal biceps tear  HPI: Mario Lawrence is a 49 year old male who injured his right elbow about two weeks ago, distal biceps. The family elected to obtain an MRI which showed a high grade near complete tear of the distal biceps tendon. He states the pain has actually improved somewhat over the past couple of weeks. He has been careful not to use or lift the  arm much. It is in more of a comfortable position with the arm flexed. He is here for treatment recommendations today.  Past Medical History:  Diagnosis Date  . Anxiety   . Asthma   . Dysphagia   . ED (erectile dysfunction)   . GERD (gastroesophageal reflux disease)   . History of COVID-19 10/21/2020  . Hyperlipidemia   . Seasonal allergies     No past surgical history on file.  Family History  Problem Relation Age of Onset  . Ovarian cancer Maternal Grandmother   . Lung cancer Maternal Grandfather   . COPD Maternal Grandfather   . Colon cancer Father        in his 36s  . Ovarian cancer Paternal Grandmother    Social History:  reports that he quit smoking about 11 years ago. He has never used smokeless tobacco. He reports current alcohol use. He reports that he does not use drugs.  Allergies:  Allergies  Allergen Reactions  . Shrimp [Shellfish Allergy] Hives and Shortness Of Breath  . Codeine Nausea Only  . Tramadol Other (See Comments)    Does likes like how it makes him feel    (Not in a hospital admission)   No results found for this or any previous visit (from the past 48 hour(s)). No results found.  Review of Systems  Constitutional: Positive for fever.  Musculoskeletal: Positive for arthralgias.  All other systems reviewed and are negative.   There were no vitals taken for this visit. Physical Exam Constitutional:      General: He is not in acute distress.    Appearance: Normal appearance.  HENT:     Head: Normocephalic and atraumatic.   Eyes:     Extraocular Movements: Extraocular movements intact.     Pupils: Pupils are equal, round, and reactive to light.  Cardiovascular:     Rate and Rhythm: Normal rate.     Pulses: Normal pulses.  Pulmonary:     Effort: Pulmonary effort is normal. No respiratory distress.  Abdominal:     General: Abdomen is flat. There is no distension.  Musculoskeletal:     Cervical back: Normal range of motion.     Comments: On examination of the right upper extremity he is neurovascularly intact. He does have a palpable biceps tendon at the elbow  crease. He has minimal tenderness to palpation. Mild discomfort through passive resisted supination of the wrist.   Lymphadenopathy:     Cervical: No cervical adenopathy.  Skin:    General: Skin is warm and dry.     Findings: No erythema or rash.  Neurological:     General: No focal deficit present.     Mental Status: He is alert and oriented to person, place, and time.  Psychiatric:        Mood and Affect: Mood normal.        Behavior: Behavior normal.      Assessment/Plan Right distal biceps high grade partial tear.   Initially elected to monitor nonoperative treatment  but upon return visit and further discussion with dr caffrey in this active 49yo with near complete distal biceps tear did discuss risks and benefits of repair and he wishes to proceed.    Margart Sickles, PA-C 11/27/2020, 7:05 PM

## 2020-12-01 ENCOUNTER — Other Ambulatory Visit (HOSPITAL_COMMUNITY): Payer: 59

## 2020-12-03 ENCOUNTER — Ambulatory Visit (HOSPITAL_BASED_OUTPATIENT_CLINIC_OR_DEPARTMENT_OTHER): Payer: 59 | Admitting: Anesthesiology

## 2020-12-03 ENCOUNTER — Encounter (HOSPITAL_BASED_OUTPATIENT_CLINIC_OR_DEPARTMENT_OTHER): Payer: Self-pay | Admitting: Orthopedic Surgery

## 2020-12-03 ENCOUNTER — Other Ambulatory Visit: Payer: Self-pay

## 2020-12-03 ENCOUNTER — Ambulatory Visit (HOSPITAL_BASED_OUTPATIENT_CLINIC_OR_DEPARTMENT_OTHER)
Admission: RE | Admit: 2020-12-03 | Discharge: 2020-12-03 | Disposition: A | Discharge status: Home / Self Care | Payer: 59 | Attending: Orthopedic Surgery | Admitting: Orthopedic Surgery

## 2020-12-03 ENCOUNTER — Encounter (HOSPITAL_BASED_OUTPATIENT_CLINIC_OR_DEPARTMENT_OTHER)
Admission: RE | Disposition: A | Discharge status: Home / Self Care | Payer: Self-pay | Source: Home / Self Care | Attending: Orthopedic Surgery

## 2020-12-03 DIAGNOSIS — S46211A Strain of muscle, fascia and tendon of other parts of biceps, right arm, initial encounter: Secondary | ICD-10-CM | POA: Insufficient documentation

## 2020-12-03 DIAGNOSIS — Z885 Allergy status to narcotic agent status: Secondary | ICD-10-CM | POA: Insufficient documentation

## 2020-12-03 DIAGNOSIS — Z87891 Personal history of nicotine dependence: Secondary | ICD-10-CM | POA: Insufficient documentation

## 2020-12-03 DIAGNOSIS — X58XXXA Exposure to other specified factors, initial encounter: Secondary | ICD-10-CM | POA: Diagnosis not present

## 2020-12-03 DIAGNOSIS — Z8616 Personal history of COVID-19: Secondary | ICD-10-CM | POA: Insufficient documentation

## 2020-12-03 HISTORY — PX: DISTAL BICEPS TENDON REPAIR: SHX1461

## 2020-12-03 HISTORY — DX: Anxiety disorder, unspecified: F41.9

## 2020-12-03 SURGERY — REPAIR, TENDON, BICEPS, DISTAL
Anesthesia: Monitor Anesthesia Care | Site: Arm Upper | Laterality: Right

## 2020-12-03 MED ORDER — OXYCODONE HCL 5 MG PO TABS: ORAL_TABLET | ORAL | 0 refills | Status: DC

## 2020-12-03 MED ORDER — METHOCARBAMOL 500 MG PO TABS: 500.0000 mg | ORAL_TABLET | Freq: Four times a day (QID) | ORAL | 0 refills | Status: DC | PRN

## 2020-12-03 MED ORDER — FENTANYL CITRATE (PF) 100 MCG/2ML IJ SOLN
INTRAMUSCULAR | Status: AC
  Filled 2020-12-03: qty 2

## 2020-12-03 MED ORDER — LACTATED RINGERS IV SOLN: INTRAVENOUS | Status: DC

## 2020-12-03 MED ORDER — PROPOFOL 10 MG/ML IV BOLUS
INTRAVENOUS | Status: DC | PRN
  Administered 2020-12-03: 50 mg via INTRAVENOUS

## 2020-12-03 MED ORDER — HYDROMORPHONE HCL 1 MG/ML IJ SOLN: 0.2500 mg | INTRAMUSCULAR | Status: DC | PRN

## 2020-12-03 MED ORDER — PROMETHAZINE HCL 25 MG/ML IJ SOLN: 6.2500 mg | INTRAMUSCULAR | Status: DC | PRN

## 2020-12-03 MED ORDER — ONDANSETRON HCL 4 MG/2ML IJ SOLN
INTRAMUSCULAR | Status: DC | PRN
  Administered 2020-12-03: 4 mg via INTRAVENOUS

## 2020-12-03 MED ORDER — PROPOFOL 500 MG/50ML IV EMUL
INTRAVENOUS | Status: DC | PRN
  Administered 2020-12-03: 100 ug/kg/min via INTRAVENOUS

## 2020-12-03 MED ORDER — PROPOFOL 500 MG/50ML IV EMUL
INTRAVENOUS | Status: AC
  Filled 2020-12-03: qty 50

## 2020-12-03 MED ORDER — OXYCODONE HCL 5 MG PO TABS: 5.0000 mg | ORAL_TABLET | Freq: Once | ORAL | Status: DC | PRN

## 2020-12-03 MED ORDER — MIDAZOLAM HCL 5 MG/5ML IJ SOLN
INTRAMUSCULAR | Status: DC | PRN
  Administered 2020-12-03: 1 mg via INTRAVENOUS

## 2020-12-03 MED ORDER — OXYCODONE HCL 5 MG/5ML PO SOLN: 5.0000 mg | Freq: Once | ORAL | Status: DC | PRN

## 2020-12-03 MED ORDER — ONDANSETRON HCL 4 MG PO TABS: 4.0000 mg | ORAL_TABLET | Freq: Three times a day (TID) | ORAL | 1 refills | Status: DC | PRN

## 2020-12-03 MED ORDER — SODIUM CHLORIDE 0.9 % IV SOLN: INTRAVENOUS | Status: DC

## 2020-12-03 MED ORDER — MIDAZOLAM HCL 2 MG/2ML IJ SOLN
INTRAMUSCULAR | Status: AC
  Filled 2020-12-03: qty 2

## 2020-12-03 MED ORDER — MIDAZOLAM HCL 2 MG/2ML IJ SOLN
2.0000 mg | Freq: Once | INTRAMUSCULAR | Status: AC
  Administered 2020-12-03: 2 mg via INTRAVENOUS

## 2020-12-03 MED ORDER — ROPIVACAINE HCL 5 MG/ML IJ SOLN
INTRAMUSCULAR | Status: DC | PRN
  Administered 2020-12-03: 30 mL via PERINEURAL

## 2020-12-03 MED ORDER — CEFAZOLIN SODIUM-DEXTROSE 2-4 GM/100ML-% IV SOLN
INTRAVENOUS | Status: AC
  Filled 2020-12-03: qty 100

## 2020-12-03 MED ORDER — CEFAZOLIN SODIUM-DEXTROSE 2-4 GM/100ML-% IV SOLN
2.0000 g | INTRAVENOUS | Status: AC
  Administered 2020-12-03: 2 g via INTRAVENOUS

## 2020-12-03 MED ORDER — FENTANYL CITRATE (PF) 100 MCG/2ML IJ SOLN
100.0000 ug | Freq: Once | INTRAMUSCULAR | Status: AC
  Administered 2020-12-03: 100 ug via INTRAVENOUS

## 2020-12-03 SURGICAL SUPPLY — 76 items
ANCH SUT 12 BICEPS BTN STRL (Orthopedic Implant) ×1 IMPLANT
BLADE SURG 15 STRL LF DISP TIS (BLADE) ×2 IMPLANT
BLADE SURG 15 STRL SS (BLADE) ×4
BNDG CMPR 9X4 STRL LF SNTH (GAUZE/BANDAGES/DRESSINGS) ×1
BNDG ELASTIC 3X5.8 VLCR STR LF (GAUZE/BANDAGES/DRESSINGS) ×2 IMPLANT
BNDG ELASTIC 4X5.8 VLCR STR LF (GAUZE/BANDAGES/DRESSINGS) ×2 IMPLANT
BNDG ESMARK 4X9 LF (GAUZE/BANDAGES/DRESSINGS) ×2 IMPLANT
BNDG GAUZE ELAST 4 BULKY (GAUZE/BANDAGES/DRESSINGS) ×2 IMPLANT
BUTTON DISTAL BICEPS (Orthopedic Implant) ×1 IMPLANT
CORD BIPOLAR FORCEPS 12FT (ELECTRODE) ×2 IMPLANT
COVER BACK TABLE 60X90IN (DRAPES) ×2 IMPLANT
COVER WAND RF STERILE (DRAPES) IMPLANT
CUFF TOURN SGL QUICK 18X4 (TOURNIQUET CUFF) ×1 IMPLANT
CUFF TOURN SGL QUICK 24 (TOURNIQUET CUFF)
CUFF TRNQT CYL 24X4X16.5-23 (TOURNIQUET CUFF) IMPLANT
DECANTER SPIKE VIAL GLASS SM (MISCELLANEOUS) ×2 IMPLANT
DRAPE EXTREMITY T 121X128X90 (DISPOSABLE) ×2 IMPLANT
DRAPE IMP U-DRAPE 54X76 (DRAPES) ×2 IMPLANT
DRAPE OEC MINIVIEW 54X84 (DRAPES) ×2 IMPLANT
DRAPE SURG 17X23 STRL (DRAPES) ×2 IMPLANT
DRSG EMULSION OIL 3X3 NADH (GAUZE/BANDAGES/DRESSINGS) ×2 IMPLANT
DRSG PAD ABDOMINAL 8X10 ST (GAUZE/BANDAGES/DRESSINGS) ×2 IMPLANT
DURAPREP 26ML APPLICATOR (WOUND CARE) ×2 IMPLANT
ELECT REM PT RETURN 9FT ADLT (ELECTROSURGICAL)
ELECTRODE REM PT RTRN 9FT ADLT (ELECTROSURGICAL) IMPLANT
GAUZE SPONGE 4X4 12PLY STRL (GAUZE/BANDAGES/DRESSINGS) ×2 IMPLANT
GLOVE BIO SURGEON STRL SZ 6.5 (GLOVE) ×1 IMPLANT
GLOVE BIO SURGEON STRL SZ7.5 (GLOVE) ×2 IMPLANT
GLOVE SRG 8 PF TXTR STRL LF DI (GLOVE) ×3 IMPLANT
GLOVE SURG ORTHO 8.0 STRL STRW (GLOVE) ×2 IMPLANT
GLOVE SURG SS PI 7.0 STRL IVOR (GLOVE) ×1 IMPLANT
GLOVE SURG UNDER POLY LF SZ7 (GLOVE) ×1 IMPLANT
GLOVE SURG UNDER POLY LF SZ8 (GLOVE) ×6
GOWN STRL REUS W/ TWL LRG LVL3 (GOWN DISPOSABLE) ×1 IMPLANT
GOWN STRL REUS W/ TWL XL LVL3 (GOWN DISPOSABLE) ×2 IMPLANT
GOWN STRL REUS W/TWL LRG LVL3 (GOWN DISPOSABLE) ×4
GOWN STRL REUS W/TWL XL LVL3 (GOWN DISPOSABLE) ×4
INSERTER BUTTON (SYSTAGENIX WOUND MANAGEMENT) ×1 IMPLANT
LOOP VESSEL MAXI BLUE (MISCELLANEOUS) ×1 IMPLANT
NDL HYPO 25X1 1.5 SAFETY (NEEDLE) ×1 IMPLANT
NDL MAYO 6 CRC TAPER PT (NEEDLE) IMPLANT
NEEDLE HYPO 25X1 1.5 SAFETY (NEEDLE) ×2 IMPLANT
NEEDLE MAYO 6 CRC TAPER PT (NEEDLE) IMPLANT
NS IRRIG 1000ML POUR BTL (IV SOLUTION) ×2 IMPLANT
PACK BASIN DAY SURGERY FS (CUSTOM PROCEDURE TRAY) ×2 IMPLANT
PAD CAST 4YDX4 CTTN HI CHSV (CAST SUPPLIES) ×2 IMPLANT
PADDING CAST ABS 3INX4YD NS (CAST SUPPLIES) ×1
PADDING CAST ABS 4INX4YD NS (CAST SUPPLIES) ×1
PADDING CAST ABS COTTON 3X4 (CAST SUPPLIES) ×1 IMPLANT
PADDING CAST ABS COTTON 4X4 ST (CAST SUPPLIES) ×1 IMPLANT
PADDING CAST COTTON 4X4 STRL (CAST SUPPLIES) ×4
PENCIL SMOKE EVACUATOR (MISCELLANEOUS) IMPLANT
PIN DRILL ACL TIGHTROPE 4MM (PIN) ×1 IMPLANT
SHEET MEDIUM DRAPE 40X70 STRL (DRAPES) IMPLANT
SLEEVE SCD COMPRESS KNEE MED (MISCELLANEOUS) ×2 IMPLANT
SPLINT FAST PLASTER 5X30 (CAST SUPPLIES)
SPLINT PLASTER CAST FAST 5X30 (CAST SUPPLIES) IMPLANT
SUCTION FRAZIER HANDLE 10FR (MISCELLANEOUS) ×2
SUCTION TUBE FRAZIER 10FR DISP (MISCELLANEOUS) ×1 IMPLANT
SUT ETHILON 3 0 PS 1 (SUTURE) ×2 IMPLANT
SUT FIBERWIRE #2 38 T-5 BLUE (SUTURE)
SUT MNCRL AB 3-0 PS2 18 (SUTURE) ×1 IMPLANT
SUT VIC AB 0 CT1 27 (SUTURE) ×2
SUT VIC AB 0 CT1 27XBRD ANBCTR (SUTURE) IMPLANT
SUT VIC AB 2-0 SH 18 (SUTURE) ×3 IMPLANT
SUT VIC AB 3-0 PS1 18 (SUTURE) ×2
SUT VIC AB 3-0 PS1 18XBRD (SUTURE) IMPLANT
SUTURE FIBERWR #2 38 T-5 BLUE (SUTURE) IMPLANT
SUTURE TAPE 1.3 FIBERLOP 20 ST (SUTURE) IMPLANT
SUTURETAPE 1.3 FIBERLOOP 20 ST (SUTURE)
SYR BULB EAR ULCER 3OZ GRN STR (SYRINGE) ×2 IMPLANT
SYR CONTROL 10ML LL (SYRINGE) ×2 IMPLANT
TOWEL GREEN STERILE FF (TOWEL DISPOSABLE) ×4 IMPLANT
TUBE CONNECTING 20X1/4 (TUBING) ×2 IMPLANT
UNDERPAD 30X36 HEAVY ABSORB (UNDERPADS AND DIAPERS) ×2 IMPLANT
YANKAUER SUCT BULB TIP NO VENT (SUCTIONS) ×2 IMPLANT

## 2020-12-03 NOTE — Anesthesia Postprocedure Evaluation (Signed)
Anesthesia Post Note  Patient: Mario Lawrence  Procedure(s) Performed: DISTAL BICEPS TENDON REPAIR (Right Arm Upper)     Patient location during evaluation: PACU Anesthesia Type: Regional Level of consciousness: awake and alert Pain management: pain level controlled Vital Signs Assessment: post-procedure vital signs reviewed and stable Respiratory status: spontaneous breathing, nonlabored ventilation and respiratory function stable Cardiovascular status: blood pressure returned to baseline and stable Postop Assessment: no apparent nausea or vomiting Anesthetic complications: no   No complications documented.  Last Vitals:  Vitals:   12/03/20 1500 12/03/20 1515  BP:  116/89  Pulse: 71 67  Resp: 16 16  Temp:  (!) 36.3 C  SpO2: 100% 100%    Last Pain:  Vitals:   12/03/20 1515  TempSrc:   PainSc: 0-No pain                 Lowella Curb

## 2020-12-03 NOTE — Interval H&P Note (Signed)
History and Physical Interval Note:  12/03/2020 12:25 PM  Mario Lawrence  has presented today for surgery, with the diagnosis of RIGHT DISTAL BICEPS TENDON RUPTURE.  The various methods of treatment have been discussed with the patient and family. After consideration of risks, benefits and other options for treatment, the patient has consented to  Procedure(s): DISTAL BICEPS TENDON REPAIR (Right) as a surgical intervention.  The patient's history has been reviewed, patient examined, no change in status, stable for surgery.  I have reviewed the patient's chart and labs.  Questions were answered to the patient's satisfaction.     Thera Flake

## 2020-12-03 NOTE — Transfer of Care (Signed)
Immediate Anesthesia Transfer of Care Note  Patient: Mario Lawrence  Procedure(s) Performed: DISTAL BICEPS TENDON REPAIR (Right Arm Upper)  Patient Location: PACU  Anesthesia Type:MAC and Regional  Level of Consciousness: awake, alert  and oriented  Airway & Oxygen Therapy: Patient Spontanous Breathing  Post-op Assessment: Report given to RN and Post -op Vital signs reviewed and stable  Post vital signs: Reviewed and stable  Last Vitals:  Vitals Value Taken Time  BP 122/89 12/03/20 1451  Temp 36.3 C 12/03/20 1451  Pulse 74 12/03/20 1453  Resp 14 12/03/20 1451  SpO2 100 % 12/03/20 1453  Vitals shown include unvalidated device data.  Last Pain:  Vitals:   12/03/20 1451  TempSrc:   PainSc: 0-No pain      Patients Stated Pain Goal: 5 (47/82/95 6213)  Complications: No complications documented.

## 2020-12-03 NOTE — Anesthesia Procedure Notes (Signed)
Anesthesia Regional Block: Supraclavicular block   Pre-Anesthetic Checklist: ,, timeout performed, Correct Patient, Correct Site, Correct Laterality, Correct Procedure, Correct Position, site marked, Risks and benefits discussed,  Surgical consent,  Pre-op evaluation,  At surgeon's request and post-op pain management  Laterality: Right  Prep: chloraprep       Needles:  Injection technique: Single-shot  Needle Type: Stimiplex     Needle Length: 9cm  Needle Gauge: 21     Additional Needles:   Procedures:,,,, ultrasound used (permanent image in chart),,,,  Narrative:  Start time: 12/03/2020 12:22 PM End time: 12/03/2020 12:27 PM Injection made incrementally with aspirations every 5 mL.  Performed by: Personally  Anesthesiologist: Lowella Curb, MD

## 2020-12-03 NOTE — Brief Op Note (Signed)
12/03/2020  2:47 PM  PATIENT:  Mario Lawrence  49 y.o. male  PRE-OPERATIVE DIAGNOSIS:  RIGHT DISTAL BICEPS TENDON RUPTURE  POST-OPERATIVE DIAGNOSIS:  RIGHT DISTAL BICEPS TENDON RUPTURE  PROCEDURE:  Procedure(s): DISTAL BICEPS TENDON REPAIR (Right)  SURGEON:  Surgeon(s) and Role:    Frederico Hamman, MD - Primary  PHYSICIAN ASSISTANT: Margart Sickles, PA-C  ASSISTANTS:    ANESTHESIA:   regional and general  EBL:  10 mL   BLOOD ADMINISTERED:none  DRAINS: none   LOCAL MEDICATIONS USED:  none  SPECIMEN:  No Specimen  DISPOSITION OF SPECIMEN:  N/A  COUNTS:  YES  TOURNIQUET:   Total Tourniquet Time Documented: Upper Arm (Right) - 82 minutes Total: Upper Arm (Right) - 82 minutes   DICTATION: .Other Dictation: Dictation Number unknown  PLAN OF CARE: Discharge to home after PACU  PATIENT DISPOSITION:  PACU - hemodynamically stable.   Delay start of Pharmacological VTE agent (>24hrs) due to surgical blood loss or risk of bleeding: not applicable

## 2020-12-03 NOTE — Progress Notes (Signed)
Assisted Dr. Miller with right, ultrasound guided, supraclavicular block. Side rails up, monitors on throughout procedure. See vital signs in flow sheet. Tolerated Procedure well. 

## 2020-12-03 NOTE — Discharge Instructions (Signed)
Post Anesthesia Home Care Instructions  Activity: Get plenty of rest for the remainder of the day. A responsible individual must stay with you for 24 hours following the procedure.  For the next 24 hours, DO NOT: -Drive a car -Advertising copywriter -Drink alcoholic beverages -Take any medication unless instructed by your physician -Make any legal decisions or sign important papers.  Meals: Start with liquid foods such as gelatin or soup. Progress to regular foods as tolerated. Avoid greasy, spicy, heavy foods. If nausea and/or vomiting occur, drink only clear liquids until the nausea and/or vomiting subsides. Call your physician if vomiting continues.  Special Instructions/Symptoms: Your throat may feel dry or sore from the anesthesia or the breathing tube placed in your throat during surgery. If this causes discomfort, gargle with warm salt water. The discomfort should disappear within 24 hours.  If you had a scopolamine patch placed behind your ear for the management of post- operative nausea and/or vomiting:  1. The medication in the patch is effective for 72 hours, after which it should be removed.  Wrap patch in a tissue and discard in the trash. Wash hands thoroughly with soap and water. 2. You may remove the patch earlier than 72 hours if you experience unpleasant side effects which may include dry mouth, dizziness or visual disturbances. 3. Avoid touching the patch. Wash your hands with soap and water after contact with the patch.    Regional Anesthesia Blocks  1. Numbness or the inability to move the "blocked" extremity may last from 3-48 hours after placement. The length of time depends on the medication injected and your individual response to the medication. If the numbness is not going away after 48 hours, call your surgeon.  2. The extremity that is blocked will need to be protected until the numbness is gone and the  Strength has returned. Because you cannot feel it, you will  need to take extra care to avoid injury. Because it may be weak, you may have difficulty moving it or using it. You may not know what position it is in without looking at it while the block is in effect.  3. For blocks in the legs and feet, returning to weight bearing and walking needs to be done carefully. You will need to wait until the numbness is entirely gone and the strength has returned. You should be able to move your leg and foot normally before you try and bear weight or walk. You will need someone to be with you when you first try to ensure you do not fall and possibly risk injury.  4. Bruising and tenderness at the needle site are common side effects and will resolve in a few days.  5. Persistent numbness or new problems with movement should be communicated to the surgeon or the [facility] [phone] [facility] [phone]).    Diet: As you were doing prior to hospitalization   Activity: Increase activity slowly as tolerated  No lifting or driving for 6 weeks   Shower: May shower but need to keep splint dry.  Dressing:leave splint in place until follow up  Weight Bearing: nonweight bearing operative arm.  To prevent constipation: you may use a stool softener such as -  Colace ( over the counter) 100 mg by mouth twice a day  Drink plenty of fluids ( prune juice may be helpful) and high fiber foods  Miralax ( over the counter) for constipation as needed.   Precautions: If you  experience chest pain or shortness of breath - call 911 immediately For transfer to the hospital emergency department!!  If you develop a fever greater that 101 F, purulent drainage from wound, increased redness or drainage from wound, or calf pain -- Call the office   Follow- Up Appointment: Please call for an appointment to be seen in 2 weeks or as previously scheduled [facility] - [phone]

## 2020-12-03 NOTE — Anesthesia Preprocedure Evaluation (Signed)
Anesthesia Evaluation  Patient identified by MRN, date of birth, ID band Patient awake    Reviewed: Allergy & Precautions, H&P , NPO status , Patient's Chart, lab work & pertinent test results  Airway Mallampati: II  TM Distance: >3 FB Neck ROM: Full    Dental no notable dental hx.    Pulmonary asthma , former smoker,    Pulmonary exam normal breath sounds clear to auscultation       Cardiovascular negative cardio ROS Normal cardiovascular exam Rhythm:Regular Rate:Normal     Neuro/Psych Anxiety negative neurological ROS  negative psych ROS   GI/Hepatic Neg liver ROS, GERD  ,  Endo/Other  negative endocrine ROS  Renal/GU negative Renal ROS  negative genitourinary   Musculoskeletal negative musculoskeletal ROS (+)   Abdominal (+) + obese,   Peds negative pediatric ROS (+)  Hematology negative hematology ROS (+)   Anesthesia Other Findings   Reproductive/Obstetrics negative OB ROS                             Anesthesia Physical Anesthesia Plan  ASA: II  Anesthesia Plan: MAC and Regional   Post-op Pain Management:    Induction: Intravenous  PONV Risk Score and Plan: 1 and Ondansetron and Treatment may vary due to age or medical condition  Airway Management Planned: Simple Face Mask  Additional Equipment:   Intra-op Plan:   Post-operative Plan:   Informed Consent: I have reviewed the patients History and Physical, chart, labs and discussed the procedure including the risks, benefits and alternatives for the proposed anesthesia with the patient or authorized representative who has indicated his/her understanding and acceptance.     Dental advisory given  Plan Discussed with: CRNA  Anesthesia Plan Comments:         Anesthesia Quick Evaluation

## 2020-12-04 NOTE — Op Note (Signed)
[name], [name] MEDICAL RECORD [id number] ACCOUNT [id number] DATE OF BIRTH:Feb 20, 1971 FACILITY: [facility] LOCATION: [facility] PHYSICIAN:W. Andras Grunewald JR., MD  OPERATIVE REPORT  DATE OF PROCEDURE:  12/03/2020  PREOPERATIVE DIAGNOSIS:  Severe partial tear, biceps tendon, right elbow.  POSTOPERATIVE DIAGNOSIS:  Severe partial tear, biceps tendon, right elbow.  OPERATION:  Right biceps tendon repair (Arthrex Endobutton).  SURGEON:  Marcie Mowers, MD  ASSISTANTVincent Peyer.  TOURNIQUET TIME:  75 minutes.  BLOCK:  Supraclavicular block.  DESCRIPTION OF PROCEDURE:  Sterile prep and drape, sterile tourniquet inflated to 250.  Curvilinear incision was made, crossing the antecubital crease about a 45 degree angle.  We did not use the full extent of the incision due to the fact this was a  partial tear and there was no significant retraction.  We split the interval between the brachialis and biceps.  The biceps itself had severe partial tear.  We traced it down towards its insertion on the radial tuberosity, carefully protected ____  antebrachial cutaneous nerve as well as large veins.  We did have to ligate one crossing vein to allow access to the tuberosity.  Lacertus was split as well.  Bulbous thickened tendon with a severe partial rupture was freed up.  We then freshened the  edges with a 15 to taper it down to accept the 8 mm reaming hole.  Then a whip stitch with Arthrex FiberTape on it to create an 8 mm distal end of the biceps.  We confirmed positioning of the tuberosity with the guide pin with the mini C-arm, followed by  placement of an 8 mm unicortical hole.  We then placed the tendon into the hole at the level of the tuberosity, confirmed positioning and good deployment of the Endobutton on multiple views of the elbow with the mini C-arm.  The split in the lacertus  was closed with Vicryl as well as the subcutaneous tissues with Vicryl and the skin with Monocryl.  A  lightly compressive sterile dressing applied.  Tourniquet was released as well as a posterior splint.  Tourniquet was released after application of the  dressing.  HN/NUANCE  D:12/03/2020 T:12/04/2020 JOB:[id number]

## 2020-12-08 ENCOUNTER — Encounter (HOSPITAL_BASED_OUTPATIENT_CLINIC_OR_DEPARTMENT_OTHER): Payer: Self-pay | Admitting: Orthopedic Surgery

## 2021-01-29 ENCOUNTER — Other Ambulatory Visit: Payer: Self-pay | Admitting: Family Medicine

## 2021-01-29 NOTE — Telephone Encounter (Signed)
Not on med list but info is from last fill on chart  Name of Medication: Klonopin Name of Pharmacy:  [address] Last Fill or Written Date and Quantity: 07/16/20 #10 tabs 0 refill Last Office Visit and Type: 10/05/2019 Next Office Visit and Type: none scheduled

## 2021-03-13 ENCOUNTER — Other Ambulatory Visit: Payer: Self-pay | Admitting: *Deleted

## 2021-03-13 MED ORDER — PANTOPRAZOLE SODIUM 40 MG PO TBEC: 40.0000 mg | DELAYED_RELEASE_TABLET | Freq: Two times a day (BID) | ORAL | 0 refills | Status: DC

## 2021-04-09 ENCOUNTER — Other Ambulatory Visit: Payer: Self-pay | Admitting: Family Medicine

## 2021-04-09 NOTE — Telephone Encounter (Signed)
Name of Medication: Klonopin Name of Pharmacy:  [facility] [address] Last Fill or Written Date and Quantity:01/29/21 #10 tabs 0 refill Last Office Visit and Type: 10/29/20 virtual for covid (CPE was last done 10/05/2019) Next Office Visit and Type: none scheduled (no f/u or CPE in 1.5 yrs)

## 2021-05-11 ENCOUNTER — Ambulatory Visit: Payer: Self-pay

## 2021-05-16 ENCOUNTER — Encounter: Payer: Self-pay | Admitting: Family Medicine

## 2021-05-17 MED ORDER — SERTRALINE HCL 50 MG PO TABS: ORAL_TABLET | ORAL | 0 refills | Status: DC

## 2021-05-22 ENCOUNTER — Telehealth: Payer: Self-pay | Admitting: Family Medicine

## 2021-05-26 NOTE — Telephone Encounter (Signed)
Last office visit 10/29/2020 (video) for Covid 19.  Last CPE 10/05/2019.  Last refilled 10/05/2019 for #10 with 11 refills.  No future appointments.  Refill?

## 2021-05-26 NOTE — Telephone Encounter (Signed)
I sent it in  Please call him to schedule a physical  Thanks

## 2021-05-27 NOTE — Telephone Encounter (Signed)
Called and left vm for patient to call the office back. NO details left. EM

## 2021-05-28 NOTE — Telephone Encounter (Signed)
Called patient 3 times no answer or return call. Will mail letter. EM

## 2021-08-27 ENCOUNTER — Other Ambulatory Visit: Payer: Self-pay | Admitting: Family Medicine

## 2021-08-28 NOTE — Telephone Encounter (Signed)
No recent or future appts., please advise  

## 2021-08-28 NOTE — Telephone Encounter (Signed)
Please schedule a PE and refill until then  

## 2021-10-20 ENCOUNTER — Other Ambulatory Visit: Payer: Self-pay | Admitting: Family Medicine

## 2021-10-21 NOTE — Telephone Encounter (Signed)
Name of Medication: Klonopin Name of Pharmacy:  [facility] [address] Last Fill or Written Date and Quantity: 04/09/21 #10 tabs 0 refill Last Office Visit and Type: 10/29/20 virtual for covid (CPE was last done 10/05/2019) Next Office Visit and Type: none scheduled

## 2021-11-22 ENCOUNTER — Other Ambulatory Visit: Payer: Self-pay | Admitting: Family Medicine

## 2021-11-25 ENCOUNTER — Other Ambulatory Visit: Payer: Self-pay

## 2021-11-25 ENCOUNTER — Encounter: Payer: Self-pay | Admitting: Family Medicine

## 2021-11-25 ENCOUNTER — Ambulatory Visit (INDEPENDENT_AMBULATORY_CARE_PROVIDER_SITE_OTHER): Payer: 59 | Admitting: Family Medicine

## 2021-11-25 VITALS — BP 128/82 | HR 45 | Temp 97.1°F | Ht 74.0 in | Wt 234.1 lb

## 2021-11-25 DIAGNOSIS — Z79899 Other long term (current) drug therapy: Secondary | ICD-10-CM

## 2021-11-25 DIAGNOSIS — E7800 Pure hypercholesterolemia, unspecified: Secondary | ICD-10-CM

## 2021-11-25 DIAGNOSIS — N529 Male erectile dysfunction, unspecified: Secondary | ICD-10-CM | POA: Diagnosis not present

## 2021-11-25 DIAGNOSIS — F4010 Social phobia, unspecified: Secondary | ICD-10-CM | POA: Diagnosis not present

## 2021-11-25 DIAGNOSIS — K219 Gastro-esophageal reflux disease without esophagitis: Secondary | ICD-10-CM | POA: Diagnosis not present

## 2021-11-25 DIAGNOSIS — Z1211 Encounter for screening for malignant neoplasm of colon: Secondary | ICD-10-CM

## 2021-11-25 LAB — LIPID PANEL
Cholesterol: 206 mg/dL — ABNORMAL HIGH (ref 0–200)
HDL: 43 mg/dL (ref >39.00)
NonHDL: 163.07
Total CHOL/HDL Ratio: 5
Triglycerides: 205 mg/dL — ABNORMAL HIGH (ref 0.0–149.0)
VLDL: 41 mg/dL — ABNORMAL HIGH (ref 0.0–40.0)

## 2021-11-25 LAB — CBC WITH DIFFERENTIAL/PLATELET
Basophils Absolute: 0.1 10*3/uL (ref 0.0–0.1)
Basophils Relative: 1.2 % (ref 0.0–3.0)
Eosinophils Absolute: 0.4 10*3/uL (ref 0.0–0.7)
Eosinophils Relative: 6.2 % — ABNORMAL HIGH (ref 0.0–5.0)
HCT: 46 % (ref 39.0–52.0)
Hemoglobin: 15.8 g/dL (ref 13.0–17.0)
Lymphocytes Relative: 25 % (ref 12.0–46.0)
Lymphs Abs: 1.5 10*3/uL (ref 0.7–4.0)
MCHC: 34.4 g/dL (ref 30.0–36.0)
MCV: 91.3 fl (ref 78.0–100.0)
Monocytes Absolute: 0.7 10*3/uL (ref 0.1–1.0)
Monocytes Relative: 11.4 % (ref 3.0–12.0)
Neutro Abs: 3.3 10*3/uL (ref 1.4–7.7)
Neutrophils Relative %: 56.2 % (ref 43.0–77.0)
Platelets: 256 10*3/uL (ref 150.0–400.0)
RBC: 5.04 Mil/uL (ref 4.22–5.81)
RDW: 13.2 % (ref 11.5–15.5)
WBC: 5.9 10*3/uL (ref 4.0–10.5)

## 2021-11-25 LAB — COMPREHENSIVE METABOLIC PANEL
ALT: 18 U/L (ref 0–53)
AST: 16 U/L (ref 0–37)
Albumin: 4.3 g/dL (ref 3.5–5.2)
Alkaline Phosphatase: 79 U/L (ref 39–117)
BUN: 24 mg/dL — ABNORMAL HIGH (ref 6–23)
CO2: 31 mEq/L (ref 19–32)
Calcium: 10.1 mg/dL (ref 8.4–10.5)
Chloride: 101 mEq/L (ref 96–112)
Creatinine, Ser: 1.27 mg/dL (ref 0.40–1.50)
GFR: 66.81 mL/min (ref >60.00)
Glucose, Bld: 85 mg/dL (ref 70–99)
Potassium: 5.4 mEq/L — ABNORMAL HIGH (ref 3.5–5.1)
Sodium: 138 mEq/L (ref 135–145)
Total Bilirubin: 0.7 mg/dL (ref 0.2–1.2)
Total Protein: 7.2 g/dL (ref 6.0–8.3)

## 2021-11-25 LAB — VITAMIN B12: Vitamin B-12: 309 pg/mL (ref 211–911)

## 2021-11-25 LAB — LDL CHOLESTEROL, DIRECT: Direct LDL: 151 mg/dL

## 2021-11-25 LAB — TSH: TSH: 2.42 u[IU]/mL (ref 0.35–5.50)

## 2021-11-25 MED ORDER — TADALAFIL 20 MG PO TABS: ORAL_TABLET | ORAL | 11 refills | Status: DC

## 2021-11-25 MED ORDER — SERTRALINE HCL 50 MG PO TABS: ORAL_TABLET | ORAL | 3 refills | Status: DC

## 2021-11-25 MED ORDER — PANTOPRAZOLE SODIUM 40 MG PO TBEC
DELAYED_RELEASE_TABLET | ORAL | 3 refills | Status: DC
Start: 2021-11-25 — End: 2022-12-17

## 2021-11-25 NOTE — Assessment & Plan Note (Signed)
B12 added to labs  Watching renal function

## 2021-11-25 NOTE — Assessment & Plan Note (Signed)
Continues cialis prn

## 2021-11-25 NOTE — Assessment & Plan Note (Signed)
So far per pt insurance will not cover any screening  He will have new insurance in [place] and will check  Disc pros/cons of colonoscopy, cologuard and ifob  He will call when ready to choose Stressed importance of this as his father had colon cancer in his 43s  Worrisome to keep putting it off

## 2021-11-25 NOTE — Progress Notes (Signed)
Subjective:    Patient ID: Mario Lawrence, male    DOB: 01/28/1971, 50 y.o.   MRN: [id number]  This visit occurred during the SARS-CoV-2 public health emergency.  Safety protocols were in place, including screening questions prior to the visit, additional usage of staff PPE, and extensive cleaning of exam room while observing appropriate contact time as indicated for disinfecting solutions.   HPI Pt presents for f/u of chronic health problems including social anx disorder and hyperlipidemia and GERD  Wt Readings from Last 3 Encounters:  11/25/21 234 lb 2 oz (106.2 kg)  12/03/20 236 lb 12.4 oz (107.4 kg)  10/05/19 238 lb 3 oz (108 kg)   30.06 kg/m   Working and raising kids Feeling ok   Taking good care of himself    Declines flu shot today   Anxiety  Takes sertraline 50 mg in am and 25 mg in pm  Working fairly well-this helps tremendously  No problems or side eff Enables him to do what he needs to do   Clonazepam prn for public speaking   BP Readings from Last 3 Encounters:  11/25/21 128/82  12/03/20 116/89  10/05/19 122/78   Pulse Readings from Last 3 Encounters:  11/25/21 (!) 45  12/03/20 67  10/05/19 74     GERD  Protonix 40 mg bid  (sometimes he just takes it once day) Worse if he is under a lot of stress  Takes 2 pills once per week  Doing fairly well-has to take the medicine as scheduled Once in a while eats there wrong things and will flare  If he forgets immediate  Does not think he would be able to reduce   He was referred to GI in the past but he did not go     Father had colon cancer  Has not had a colonoscopy yet  Insurance did not cover it originally  Has new insurance now  Did not do [employer] does not cover cologuard   Hyperlipidemia Lab Results  Component Value Date   CHOL 190 10/05/2019   HDL 35 (L) 10/05/2019   LDLCALC 114 (H) 10/05/2019   LDLDIRECT 153.7 12/11/2009   TRIG 304 (H) 10/05/2019   CHOLHDL 5.4 (H)  10/05/2019   ED Takes cialis Lab Results  Component Value Date   PSA 0.22 02/24/2018   PSA 0.18 02/18/2017    Patient Active Problem List   Diagnosis Date Noted   Current use of proton pump inhibitor 11/25/2021   COVID-19 10/29/2020   Performance anxiety 06/05/2018   Tongue lesion 04/07/2018   Elevated random blood glucose level 03/03/2018   Social anxiety disorder 03/03/2018   Prostate cancer screening 02/13/2017   Colon cancer screening 08/19/2014   Family history of colon cancer 05/15/2013   Routine general medical examination at a health care facility 03/25/2011   Seasonal allergies    Hyperlipidemia 03/13/2010   ED (erectile dysfunction) 12/11/2009   ASTHMA 12/11/2009   GERD 12/11/2009   Past Medical History:  Diagnosis Date   Anxiety    Asthma    Dysphagia    ED (erectile dysfunction)    GERD (gastroesophageal reflux disease)    History of COVID-19 10/21/2020   Hyperlipidemia    Seasonal allergies    Past Surgical History:  Procedure Laterality Date   DISTAL BICEPS TENDON REPAIR Right 12/03/2020   Procedure: DISTAL BICEPS TENDON REPAIR;  Surgeon: Frederico Hamman, MD;  Location: [facility];  Service: Orthopedics;  Laterality:  Right;   Social History   Tobacco Use   Smoking status: Former    Types: Cigarettes    Quit date: 12/06/2008    Years since quitting: 12.9   Smokeless tobacco: Never  Vaping Use   Vaping Use: Never used  Substance Use Topics   Alcohol use: Yes    Alcohol/week: 0.0 standard drinks    Comment: Occasional-weekends   Drug use: No   Family History  Problem Relation Age of Onset   Ovarian cancer Maternal Grandmother    Lung cancer Maternal Grandfather    COPD Maternal Grandfather    Colon cancer Father        in his 75s   Ovarian cancer Paternal Grandmother    Allergies  Allergen Reactions   Shrimp [Shellfish Allergy] Hives and Shortness Of Breath   Codeine Nausea Only   Tramadol Other (See Comments)    Does  likes like how it makes him feel   Current Outpatient Medications on File Prior to Visit  Medication Sig Dispense Refill   albuterol (VENTOLIN HFA) 108 (90 Base) MCG/ACT inhaler Inhale 2 puffs into the lungs every 4 (four) hours as needed. 1 each 3   clonazePAM (KLONOPIN) 0.5 MG tablet TAKE 1/2 (ONE-HALF) TABLET BY MOUTH ONCE DAILY AS NEEDED FOR ANXIETY (PUBLIC  SPEAKING) 10 tablet 0   diphenhydrAMINE (BENADRYL) 25 MG tablet Take 2 tablets (50 mg total) by mouth every 4 (four) hours as needed for itching or allergies. 20 tablet 0   EPINEPHrine (EPIPEN) 0.3 mg/0.3 mL IJ SOAJ injection Inject 0.3 mLs (0.3 mg total) into the muscle as needed. 1 Device 2   fluticasone (FLONASE) 50 MCG/ACT nasal spray Place 2 sprays into both nostrils daily. 16 g 11   No current facility-administered medications on file prior to visit.     Review of Systems  Constitutional:  Negative for activity change, appetite change, fatigue, fever and unexpected weight change.  HENT:  Negative for congestion, rhinorrhea, sore throat and trouble swallowing.   Eyes:  Negative for pain, redness, itching and visual disturbance.  Respiratory:  Negative for cough, chest tightness, shortness of breath and wheezing.   Cardiovascular:  Negative for chest pain and palpitations.  Gastrointestinal:  Negative for abdominal pain, blood in stool, constipation, diarrhea and nausea.       Heartburn  Endocrine: Negative for cold intolerance, heat intolerance, polydipsia and polyuria.  Genitourinary:  Negative for difficulty urinating, dysuria, frequency and urgency.       ED  Musculoskeletal:  Negative for arthralgias, joint swelling and myalgias.  Skin:  Negative for pallor and rash.  Neurological:  Negative for dizziness, tremors, weakness, numbness and headaches.  Hematological:  Negative for adenopathy. Does not bruise/bleed easily.  Psychiatric/Behavioral:  Negative for decreased concentration, dysphoric mood and sleep disturbance.  The patient is nervous/anxious.       Objective:   Physical Exam Constitutional:      General: He is not in acute distress.    Appearance: Normal appearance. He is well-developed and normal weight. He is not ill-appearing or diaphoretic.     Comments: Elevated bmi is from muscular build   HENT:     Head: Normocephalic and atraumatic.     Mouth/Throat:     Mouth: Mucous membranes are moist.  Eyes:     General: No scleral icterus.    Conjunctiva/sclera: Conjunctivae normal.     Pupils: Pupils are equal, round, and reactive to light.  Neck:     Thyroid: No thyromegaly.  Vascular: No carotid bruit or JVD.  Cardiovascular:     Rate and Rhythm: Normal rate and regular rhythm.     Heart sounds: Normal heart sounds.    No gallop.  Pulmonary:     Effort: Pulmonary effort is normal. No respiratory distress.     Breath sounds: Normal breath sounds. No wheezing or rales.  Abdominal:     General: Bowel sounds are normal. There is no distension or abdominal bruit.     Palpations: Abdomen is soft. There is no mass.     Tenderness: There is no abdominal tenderness. There is no guarding or rebound.  Musculoskeletal:     Cervical back: Normal range of motion and neck supple.     Right lower leg: No edema.     Left lower leg: No edema.  Lymphadenopathy:     Cervical: No cervical adenopathy.  Skin:    General: Skin is warm and dry.     Coloration: Skin is not pale.     Findings: No erythema or rash.  Neurological:     Mental Status: He is alert.     Coordination: Coordination normal.     Deep Tendon Reflexes: Reflexes are normal and symmetric. Reflexes normal.  Psychiatric:        Attention and Perception: Attention normal.        Mood and Affect: Mood normal.        Cognition and Memory: Cognition normal.     Comments: Minimally anxious if at all  Pleasant  Candidly discusses stressors and symptoms           Assessment & Plan:   Problem List Items Addressed This Visit        Digestive   GERD (Chronic)    Pt now takes daily protonix 40 most of the time, occ has to dose twice in a day but not as often Encouraged to watch diet Discussed long term issues with ppi Will continue to watch renal function and also B12 level  Encouraged balance diet       Relevant Medications   pantoprazole (PROTONIX) 40 MG tablet     Other   Hyperlipidemia    Disc goals for lipids and reasons to control them Rev last labs with pt Rev low sat fat diet in detail  Lab today  Diet is fairly good      Relevant Medications   tadalafil (CIALIS) 20 MG tablet   Other Relevant Orders   Comprehensive metabolic panel   Lipid panel   ED (erectile dysfunction)    Continues cialis prn      Colon cancer screening    So far per pt insurance will not cover any screening  He will have new insurance in [place] and will check  Disc pros/cons of colonoscopy, cologuard and ifob  He will call when ready to choose Stressed importance of this as his father had colon cancer in his 64s  Worrisome to keep putting it off      Social anxiety disorder - Primary    Current dosing of sertraline works well (50 in am and 25 in pm)  He wants to continue this, it allows him to keep working occ clonazepam -uses cautiously  Reviewed stressors/ coping techniques/symptoms/ support sources/ tx options and side effects in detail today  Good health habits and exercise  Encourage self care      Relevant Medications   sertraline (ZOLOFT) 50 MG tablet   Other Relevant Orders   TSH  Current use of proton pump inhibitor    B12 added to labs  Watching renal function      Relevant Orders   CBC with Differential/Platelet   Comprehensive metabolic panel   Vitamin B12

## 2021-11-25 NOTE — Assessment & Plan Note (Signed)
Current dosing of sertraline works well (50 in am and 25 in pm)  He wants to continue this, it allows him to keep working occ clonazepam -uses cautiously  Reviewed stressors/ coping techniques/symptoms/ support sources/ tx options and side effects in detail today  Good health habits and exercise  Encourage self care

## 2021-11-25 NOTE — Patient Instructions (Addendum)
I want to start some colon cancer screening  These are the options  Colonoscopy  Cologuard Ifob kit   Keep taking care of yourself   No change in medications  Labs today

## 2021-11-25 NOTE — Assessment & Plan Note (Signed)
Disc goals for lipids and reasons to control them Rev last labs with pt Rev low sat fat diet in detail  Lab today  Diet is fairly good

## 2021-11-25 NOTE — Assessment & Plan Note (Signed)
Pt now takes daily protonix 40 most of the time, occ has to dose twice in a day but not as often Encouraged to watch diet Discussed long term issues with ppi Will continue to watch renal function and also B12 level  Encouraged balance diet

## 2021-11-26 ENCOUNTER — Encounter: Payer: Self-pay | Admitting: Family Medicine

## 2021-11-27 ENCOUNTER — Telehealth: Payer: Self-pay | Admitting: *Deleted

## 2021-11-27 NOTE — Telephone Encounter (Signed)
It may be a fluke - I would like to re check it in 1-2 weeks if possible

## 2021-11-27 NOTE — Telephone Encounter (Signed)
Pt sent a mychart message responding to his labs saying:  Hey.  I saw your message about potassium.  I take ibuprofen but pretty infrequently.  No vitamins.  Id say diet has been a little off the past couple months.  Nothing out of control though.   What do you recommend?

## 2021-11-27 NOTE — Telephone Encounter (Signed)
Sent mychart letting pt know Dr. Tower's comments. 

## 2021-12-09 ENCOUNTER — Encounter: Payer: Self-pay | Admitting: Internal Medicine

## 2021-12-09 ENCOUNTER — Telehealth: Payer: Self-pay | Admitting: Family Medicine

## 2021-12-09 DIAGNOSIS — Z1211 Encounter for screening for malignant neoplasm of colon: Secondary | ICD-10-CM

## 2021-12-09 NOTE — Telephone Encounter (Signed)
The referral is in.

## 2021-12-09 NOTE — Addendum Note (Signed)
Addended by: Roxy Manns A on: 12/09/2021 10:58 AM   Modules accepted: Orders

## 2021-12-09 NOTE — Telephone Encounter (Signed)
Mrs. Taddei called in and stated that her husband has an appointment on 1/26 with GI , and when she spoke to GI they told her that they never received a referral and wanted to know if its a way it can be sent again due to the upcoming appointment.

## 2021-12-11 NOTE — Telephone Encounter (Signed)
Noted  

## 2021-12-31 ENCOUNTER — Ambulatory Visit: Payer: Self-pay | Admitting: Internal Medicine

## 2022-01-06 ENCOUNTER — Other Ambulatory Visit: Payer: Self-pay | Admitting: Family Medicine

## 2022-01-06 MED ORDER — CLONAZEPAM 0.5 MG PO TABS: 0.2500 mg | ORAL_TABLET | Freq: Every day | ORAL | 0 refills | Status: DC | PRN

## 2022-01-06 NOTE — Telephone Encounter (Signed)
Name of Medication: Klonopin Name of Pharmacy:  [facility] [address] Last Fill or Written Date and Quantity: 10/21/21 #10 tabs 0 refill Last Office Visit and Type: med refill on 11/25/21 Next Office Visit and Type: none scheduled

## 2022-02-12 ENCOUNTER — Other Ambulatory Visit: Payer: Self-pay | Admitting: Family Medicine

## 2022-02-15 NOTE — Telephone Encounter (Signed)
Name of Medication: Klonopin ?Name of Pharmacy:  [facility] [address] ?Last Fill or Written Date and Quantity: 01/06/22 #10 tabs 0 refill ?Last Office Visit and Type: med refill on 11/25/21 ?Next Office Visit and Type: none scheduled  ?

## 2022-07-12 ENCOUNTER — Other Ambulatory Visit: Payer: Self-pay | Admitting: Family Medicine

## 2022-07-12 NOTE — Telephone Encounter (Signed)
Last filled 02/15/22 Last ov 11/25/21 Tried to call pt make an appt no voicemail was set up

## 2022-07-21 ENCOUNTER — Ambulatory Visit (INDEPENDENT_AMBULATORY_CARE_PROVIDER_SITE_OTHER): Payer: 59 | Admitting: Family Medicine

## 2022-07-21 ENCOUNTER — Encounter: Payer: Self-pay | Admitting: Family Medicine

## 2022-07-21 VITALS — BP 144/94 | HR 80 | Temp 97.7°F | Ht 74.0 in | Wt 232.5 lb

## 2022-07-21 DIAGNOSIS — R030 Elevated blood-pressure reading, without diagnosis of hypertension: Secondary | ICD-10-CM | POA: Insufficient documentation

## 2022-07-21 DIAGNOSIS — R42 Dizziness and giddiness: Secondary | ICD-10-CM | POA: Insufficient documentation

## 2022-07-21 DIAGNOSIS — R110 Nausea: Secondary | ICD-10-CM | POA: Diagnosis not present

## 2022-07-21 LAB — CBC WITH DIFFERENTIAL/PLATELET
Basophils Absolute: 0 10*3/uL (ref 0.0–0.1)
Basophils Relative: 1 % (ref 0.0–3.0)
Eosinophils Absolute: 0.4 10*3/uL (ref 0.0–0.7)
Eosinophils Relative: 8.5 % — ABNORMAL HIGH (ref 0.0–5.0)
HCT: 46.6 % (ref 39.0–52.0)
Hemoglobin: 15.6 g/dL (ref 13.0–17.0)
Lymphocytes Relative: 27.3 % (ref 12.0–46.0)
Lymphs Abs: 1.3 10*3/uL (ref 0.7–4.0)
MCHC: 33.6 g/dL (ref 30.0–36.0)
MCV: 92.6 fl (ref 78.0–100.0)
Monocytes Absolute: 0.6 10*3/uL (ref 0.1–1.0)
Monocytes Relative: 13.2 % — ABNORMAL HIGH (ref 3.0–12.0)
Neutro Abs: 2.3 10*3/uL (ref 1.4–7.7)
Neutrophils Relative %: 50 % (ref 43.0–77.0)
Platelets: 254 10*3/uL (ref 150.0–400.0)
RBC: 5.03 Mil/uL (ref 4.22–5.81)
RDW: 13.5 % (ref 11.5–15.5)
WBC: 4.7 10*3/uL (ref 4.0–10.5)

## 2022-07-21 LAB — POC URINALSYSI DIPSTICK (AUTOMATED)
Bilirubin, UA: NEGATIVE
Blood, UA: NEGATIVE
Glucose, UA: NEGATIVE
Ketones, UA: NEGATIVE
Leukocytes, UA: NEGATIVE
Nitrite, UA: NEGATIVE
Protein, UA: NEGATIVE
Spec Grav, UA: >=1.03 — AB (ref 1.010–1.025)
Urobilinogen, UA: 0.2 E.U./dL
pH, UA: 6 (ref 5.0–8.0)

## 2022-07-21 LAB — COMPREHENSIVE METABOLIC PANEL
ALT: 16 U/L (ref 0–53)
AST: 15 U/L (ref 0–37)
Albumin: 4.5 g/dL (ref 3.5–5.2)
Alkaline Phosphatase: 60 U/L (ref 39–117)
BUN: 19 mg/dL (ref 6–23)
CO2: 31 mEq/L (ref 19–32)
Calcium: 9.7 mg/dL (ref 8.4–10.5)
Chloride: 100 mEq/L (ref 96–112)
Creatinine, Ser: 1.29 mg/dL (ref 0.40–1.50)
GFR: 65.27 mL/min (ref >60.00)
Glucose, Bld: 89 mg/dL (ref 70–99)
Potassium: 5 mEq/L (ref 3.5–5.1)
Sodium: 136 mEq/L (ref 135–145)
Total Bilirubin: 0.8 mg/dL (ref 0.2–1.2)
Total Protein: 7 g/dL (ref 6.0–8.3)

## 2022-07-21 LAB — TSH: TSH: 1.53 u[IU]/mL (ref 0.35–5.50)

## 2022-07-21 LAB — VITAMIN B12: Vitamin B-12: 268 pg/mL (ref 211–911)

## 2022-07-21 MED ORDER — CIMETIDINE 200 MG PO TABS: 200.0000 mg | ORAL_TABLET | Freq: Two times a day (BID) | ORAL | Status: DC | PRN

## 2022-07-21 NOTE — Assessment & Plan Note (Signed)
Discussed mildly elevated blood pressures- he already limit salt in the diet.  Encouraged good hydration status.  I asked him to monitor blood pressures over the next few weeks at home and let me know if consistently elevated to consider starting antihypertensive.

## 2022-07-21 NOTE — Assessment & Plan Note (Addendum)
Overall reassuring exam today, with nonfocal neurological exam.  No missed antidepressant doses.  Negative Dix-Hallpike points against BPPV.  Question dehydration component as he has been working outside in the heat recently and admits to not keeping up with fluid intake.  Recommend increased water by 1-2 bottles per day to start, with goal 64 ounces per day. We will start work-up today with labs and urinalysis. I advised him to let us know if ongoing symptoms despite above for further evaluation.

## 2022-07-21 NOTE — Patient Instructions (Addendum)
Labs and urinalysis today.  Exam overall ok today.  Possibly dehydration related - increase water intake by 2 glasses/day, goal 64 oz/day.  Blood pressures are elevated today. Goal <140/90. Check blood pressures at local pharmacy over next 1-2 weeks and send [place] #s through mychart or call us.  Let us know if not improving with above.   Dehydration, Adult Dehydration is a condition in which there is not enough water or other fluids in the body. This happens when a person loses more fluids than he or she takes in. Important organs, such as the kidneys, brain, and heart, cannot function without a proper amount of fluids. Any loss of fluids from the body can lead to dehydration. Dehydration can be mild, moderate, or severe. It should be treated right away to prevent it from becoming severe. What are the causes? Dehydration may be caused by: Conditions that cause loss of water or other fluids, such as diarrhea, vomiting, or sweating or urinating a lot. Not drinking enough fluids, especially when you are ill or doing activities that require a lot of energy. Other illnesses and conditions, such as fever or infection. Certain medicines, such as medicines that remove excess fluid from the body (diuretics). Lack of safe drinking water. Not being able to get enough water and food. What increases the risk? The following factors may make you more likely to develop this condition: Having a long-term (chronic) illness that has not been treated properly, such as diabetes, heart disease, or kidney disease. Being 58 years of age or older. Having a disability. Living in a place that is high in altitude, where thinner, drier air causes more fluid loss. Doing exercises that put stress on your body for a long time (endurance sports). What are the signs or symptoms? Symptoms of dehydration depend on how severe it is. Mild or moderate dehydration Thirst. Dry lips or dry mouth. Dizziness or light-headedness,  especially when standing up from a seated position. Muscle cramps. Dark urine. Urine may be the color of tea. Less urine or tears produced than usual. Headache. Severe dehydration Changes in skin. Your skin may be cold and clammy, blotchy, or pale. Your skin also may not return to normal after being lightly pinched and released. Little or no tears, urine, or sweat. Changes in vital signs, such as rapid breathing and low blood pressure. Your pulse may be weak or may be faster than 100 beats a minute when you are sitting still. Other changes, such as: Feeling very thirsty. Sunken eyes. Cold hands and feet. Confusion. Being very tired (lethargic) or having trouble waking from sleep. Short-term weight loss. Loss of consciousness. How is this diagnosed? This condition is diagnosed based on your symptoms and a physical exam. You may have blood and urine tests to help confirm the diagnosis. How is this treated? Treatment for this condition depends on how severe it is. Treatment should be started right away. Do not wait until dehydration becomes severe. Severe dehydration is an emergency and needs to be treated in a hospital. Mild or moderate dehydration can be treated at home. You may be asked to: Drink more fluids. Drink an oral rehydration solution (ORS). This drink helps restore proper amounts of fluids and salts and minerals in the blood (electrolytes). Severe dehydration can be treated: With IV fluids. By correcting abnormal levels of electrolytes. This is often done by giving electrolytes through a tube that is passed through your nose and into your stomach (nasogastric tube, or NG tube). By treating  the underlying cause of dehydration. Follow these instructions at home: Oral rehydration solution If told by your health care provider, drink an ORS: Make an ORS by following instructions on the package. Start by drinking small amounts, about  cup (120 mL) every 5-10 minutes. Slowly  increase how much you drink until you have taken the amount recommended by your health care provider. Eating and drinking        Drink enough clear fluid to keep your urine pale yellow. If you were told to drink an ORS, finish the ORS first and then start slowly drinking other clear fluids. Drink fluids such as: Water. Do not drink only water. Doing that can lead to hyponatremia, which is having too little salt (sodium) in the body. Water from ice chips you suck on. Fruit juice that you have added water to (diluted fruit juice). Low-calorie sports drinks. Eat foods that contain a healthy balance of electrolytes, such as bananas, oranges, potatoes, tomatoes, and spinach. Do not drink alcohol. Avoid the following: Drinks that contain a lot of sugar. These include high-calorie sports drinks, fruit juice that is not diluted, and soda. Caffeine. Foods that are greasy or contain a lot of fat or sugar. General instructions Take over-the-counter and prescription medicines only as told by your health care provider. Do not take sodium tablets. Doing that can lead to having too much sodium in the body (hypernatremia). Return to your normal activities as told by your health care provider. Ask your health care provider what activities are safe for you. Keep all follow-up visits as told by your health care provider. This is important. Contact a health care provider if: You have muscle cramps, pain, or discomfort, such as: Pain in your abdomen and the pain gets worse or stays in one area (localizes). Stiff neck. You have a rash. You are more irritable than usual. You are sleepier or have a harder time waking than usual. You feel weak or dizzy. You feel very thirsty. Get help right away if you have: Any symptoms of severe dehydration. Symptoms of vomiting, such as: You cannot eat or drink without vomiting. Vomiting gets worse or does not go away. Vomit includes blood or green matter  (bile). Symptoms that get worse with treatment. A fever. A severe headache. Problems with urination or bowel movements, such as: Diarrhea that gets worse or does not go away. Blood in your stool (feces). This may cause stool to look black and tarry. Not urinating, or urinating only a small amount of very dark urine, within 6-8 hours. Trouble breathing. These symptoms may represent a serious problem that is an emergency. Do not wait to see if the symptoms will go away. Get medical help right away. Call your local emergency services (911 in the [place] Do not drive yourself to the hospital. Summary Dehydration is a condition in which there is not enough water or other fluids in the body. This happens when a person loses more fluids than he or she takes in. Treatment for this condition depends on how severe it is. Treatment should be started right away. Do not wait until dehydration becomes severe. Drink enough clear fluid to keep your urine pale yellow. If you were told to drink an oral rehydration solution (ORS), finish the ORS first and then start slowly drinking other clear fluids. Take over-the-counter and prescription medicines only as told by your health care provider. Get help right away if you have any symptoms of severe dehydration. This information is not intended  to replace advice given to you by your health care provider. Make sure you discuss any questions you have with your health care provider. Document Revised: 03/31/2022 Document Reviewed: 07/05/2019 Elsevier Patient Education  2023 [employer].

## 2022-07-21 NOTE — Progress Notes (Signed)
Patient ID: Mario Lawrence, male    DOB: 06/14/1971, 51 y.o.   MRN: [id number]  This visit was conducted in person.  BP (!) 144/94 (BP Location: Right Arm, Cuff Size: Large)   Pulse 80   Temp 97.7 F (36.5 C) (Temporal)   Ht 6\' 2"  (1.88 m)   Wt 232 lb 8 oz (105.5 kg)   SpO2 96%   BMI 29.85 kg/m   Orthostatic VS for the past 24 hrs (Last 3 readings):  BP- Lying BP- Standing at 3 minutes  07/21/22 0834 -- (!) 130/100  07/21/22 0831 (!) 136/92 --   CC: dizziness, nausea Subjective:   HPI: Mario Lawrence is a 51 y.o. male presenting on 07/21/2022 for Dizziness (C/o dizziness and nausea.  Started about 1.5 wks ago. )   10d h/o dizziness associated with occasional nausea. Describes motion sick feeling, "fogginess" lightheaded. Currently feels head fog. Worse in the evenings. Works on 07/23/2022 all day, this worsens it as well. Describes dizziness as brain fog, motion sickness - drunk feeling, unsteady/imbalance. No syncope/presyncope, no severe vertigo feeling.   Mild tinnitus a few days ago, none since.  Hasn't tried anything for this yet.  Occ HA managed with ibuprofen.   He takes klonopin twice weekly PRN anxiety. He also takes benadryl PRN. Continues sertraline 50mg  nightly, no missed doses. Not recently using flonase. He's using PPI PRN, but takes cimetidine regularly.   No fevers/chills, recent URI, nasal congestion, ear pain, hearing changes, vomiting, abd pain, diarrhea, palpitations, dyspnea or cough, chest pain.   No h/o hypertension. No fmhx CAD/stroke.  No diet changes, already largely limits salt intake.  No new medicines, supplements, vitamins, OTC.  No new exercise routines. Works out 1-2 days a week.   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  2 cups of coffee in am, rest is water, drinks a bottle of water a day - may not be keeping up with water intake.   He easily gets motion sickness when he  reads in a car.   H/o ear infections and cerumen impaction in the past that presented similarly.      Relevant past medical, surgical, family and social history reviewed and updated as indicated. Interim medical history since our last visit reviewed. Allergies and medications reviewed and updated. Outpatient Medications Prior to Visit  Medication Sig Dispense Refill   albuterol (VENTOLIN HFA) 108 (90 Base) MCG/ACT inhaler Inhale 2 puffs into the lungs every 4 (four) hours as needed. 1 each 3   clonazePAM (KLONOPIN) 0.5 MG tablet TAKE 1/2 TABLET BY MOUTH DAILY AS NEEDED FOR ANXIETY 10 tablet 0   diphenhydrAMINE (BENADRYL) 25 MG tablet Take 2 tablets (50 mg total) by mouth every 4 (four) hours as needed for itching or allergies. 20 tablet 0   EPINEPHrine (EPIPEN) 0.3 mg/0.3 mL IJ SOAJ injection Inject 0.3 mLs (0.3 mg total) into the muscle as needed. 1 Device 2   pantoprazole (PROTONIX) 40 MG tablet Take one pill once daily to twice daily for acid reflux 90 tablet 3   sertraline (ZOLOFT) 50 MG tablet Take 1 pill by mouth each am and 1/2 pill each pm 135 tablet 3   tadalafil (CIALIS) 20 MG tablet TAKE ONE TABLET BY MOUTH DAILY AS NEEDED FOR ERECTILE DYSFUNCTION 10 tablet 11   fluticasone (FLONASE) 50 MCG/ACT nasal spray Place 2 sprays into both nostrils daily. 16 g 11   No facility-administered medications prior  to visit.     Per HPI unless specifically indicated in ROS section below Review of Systems  Objective:  BP (!) 144/94 (BP Location: Right Arm, Cuff Size: Large)   Pulse 80   Temp 97.7 F (36.5 C) (Temporal)   Ht 6\' 2"  (1.88 m)   Wt 232 lb 8 oz (105.5 kg)   SpO2 96%   BMI 29.85 kg/m   Wt Readings from Last 3 Encounters:  07/21/22 232 lb 8 oz (105.5 kg)  11/25/21 234 lb 2 oz (106.2 kg)  12/03/20 236 lb 12.4 oz (107.4 kg)      Physical Exam Vitals and nursing note reviewed.  Constitutional:      Appearance: Normal appearance. He is not ill-appearing.  HENT:     Head:  Normocephalic and atraumatic.     Right Ear: Tympanic membrane, ear canal and external ear normal. There is no impacted cerumen.     Left Ear: Tympanic membrane, ear canal and external ear normal. There is no impacted cerumen.     Ears:     Comments: Canals clear, TMs pearly grey with good light reflex    Nose: Nose normal. No congestion.     Mouth/Throat:     Mouth: Mucous membranes are moist.     Pharynx: Oropharynx is clear. No oropharyngeal exudate or posterior oropharyngeal erythema.  Eyes:     Extraocular Movements: Extraocular movements intact.     Conjunctiva/sclera: Conjunctivae normal.     Pupils: Pupils are equal, round, and reactive to light.  Neck:     Thyroid: No thyroid mass or thyromegaly.  Cardiovascular:     Rate and Rhythm: Normal rate and regular rhythm.     Pulses: Normal pulses.     Heart sounds: Normal heart sounds. No murmur heard. Pulmonary:     Effort: Pulmonary effort is normal. No respiratory distress.     Breath sounds: Normal breath sounds. No wheezing, rhonchi or rales.  Musculoskeletal:     Cervical back: Normal range of motion and neck supple.     Right lower leg: No edema.     Left lower leg: No edema.  Skin:    General: Skin is warm and dry.     Capillary Refill: Capillary refill takes less than 2 seconds.     Findings: No rash.  Neurological:     General: No focal deficit present.     Mental Status: He is alert.     Cranial Nerves: Cranial nerves 2-12 are intact.     Sensory: Sensation is intact.     Motor: Motor function is intact.     Coordination: Coordination is intact. Romberg sign negative. Coordination normal. Finger-Nose-Finger Test normal.     Gait: Gait is intact.     Comments:  CN 2-12 intact FTN intact EOMI No pronator drift, negative romberg although somewhat unsteady with testing Neg Dix-Hallpike bilaterally  Psychiatric:        Mood and Affect: Mood normal.        Behavior: Behavior normal.       Results for orders  placed or performed in visit on 07/21/22  POCT Urinalysis Dipstick (Automated)  Result Value Ref Range   Color, UA dark yellow    Clarity, UA clear    Glucose, UA Negative Negative   Bilirubin, UA negative    Ketones, UA negative    Spec Grav, UA >=1.030 (A) 1.010 - 1.025   Blood, UA negative    pH, UA 6.0 5.0 -  8.0   Protein, UA Negative Negative   Urobilinogen, UA 0.2 0.2 or 1.0 E.U./dL   Nitrite, UA negative    Leukocytes, UA Negative Negative   Assessment & Plan:   Problem List Items Addressed This Visit     Dizziness - Primary    Overall reassuring exam today, with nonfocal neurological exam.  No missed antidepressant doses.  Negative Dix-Hallpike points against BPPV.  Question dehydration component as he has been working outside in the heat recently and admits to not keeping up with fluid intake.  Recommend increased water by 1-2 bottles per day to start, with goal 64 ounces per day. We will start work-up today with labs and urinalysis. I advised him to let us know if ongoing symptoms despite above for further evaluation.      Relevant Orders   Comprehensive metabolic panel   TSH   CBC with Differential/Platelet   Vitamin B12   Elevated blood pressure reading in office without diagnosis of hypertension    Discussed mildly elevated blood pressures- he already limit salt in the diet.  Encouraged good hydration status.  I asked him to monitor blood pressures over the next few weeks at home and let me know if consistently elevated to consider starting antihypertensive.      Other Visit Diagnoses     Nausea       Relevant Orders   POCT Urinalysis Dipstick (Automated) (Completed)        Meds ordered this encounter  Medications   cimetidine (TAGAMET) 200 MG tablet    Sig: Take 1 tablet (200 mg total) by mouth 2 (two) times daily as needed (heartburn).   Orders Placed This Encounter  Procedures   Comprehensive metabolic panel   TSH   CBC with Differential/Platelet    Vitamin B12   POCT Urinalysis Dipstick (Automated)     Patient instructions: Labs and urinalysis today.  Exam overall ok today.  Possibly dehydration related - increase water intake by 2 glasses/day, goal 64 oz/day.  Blood pressures are elevated today. Goal <140/90. Check blood pressures at local pharmacy over next 1-2 weeks and send [place] #s through mychart or call us.  Let us know if not improving with above.   Follow up plan: No follow-ups on file.  Mario Boyden, MD

## 2022-07-22 ENCOUNTER — Other Ambulatory Visit: Payer: Self-pay | Admitting: Family Medicine

## 2022-07-22 MED ORDER — CYANOCOBALAMIN 500 MCG PO TABS: 500.0000 ug | ORAL_TABLET | Freq: Every day | ORAL | Status: DC

## 2022-09-15 ENCOUNTER — Other Ambulatory Visit: Payer: Self-pay | Admitting: Family Medicine

## 2022-09-15 NOTE — Telephone Encounter (Signed)
Name of Medication: [place] Name of Pharmacy: [place] or Written Date and Quantity: 07/12/22 #10 tab/ 0 refills Last Office Visit and Type: Dizzy (Dr. Darnell Level) on 07/21/22 (Med refill on 11/25/21) Next Office Visit and Type: none scheduled

## 2022-10-20 ENCOUNTER — Encounter: Payer: Self-pay | Admitting: Family Medicine

## 2022-10-20 DIAGNOSIS — Z1211 Encounter for screening for malignant neoplasm of colon: Secondary | ICD-10-CM

## 2022-11-02 ENCOUNTER — Other Ambulatory Visit: Payer: Self-pay | Admitting: Family Medicine

## 2022-11-03 NOTE — Telephone Encounter (Signed)
Name of Medication: Klonopin Name of Pharmacy: [facility] Last Fill or Written Date and Quantity: 09/15/22 #10 tab/ 0 refills Last Office Visit and Type: Dizzy (Dr. Reece Agar) on 07/21/22 (Med refill on 11/25/21) Next Office Visit and Type: none scheduled

## 2022-11-25 ENCOUNTER — Telehealth: Payer: Self-pay | Admitting: Family Medicine

## 2022-11-26 NOTE — Telephone Encounter (Signed)
Lvmtcb, sent mychart message  

## 2022-11-26 NOTE — Telephone Encounter (Signed)
Last med refill was 11/25/21, pt is due for a med refill f/u or a CPE, please schedule and then route back to me to refill

## 2022-11-30 MED ORDER — SERTRALINE HCL 50 MG PO TABS: ORAL_TABLET | ORAL | 0 refills | Status: DC

## 2022-11-30 NOTE — Addendum Note (Signed)
Addended by: Shon Millet on: 11/30/2022 03:47 PM   Modules accepted: Orders

## 2022-11-30 NOTE — Telephone Encounter (Signed)
Patient has been scheduled

## 2022-12-16 ENCOUNTER — Other Ambulatory Visit: Payer: Self-pay | Admitting: Family Medicine

## 2022-12-23 ENCOUNTER — Telehealth: Payer: Self-pay | Admitting: Family Medicine

## 2022-12-23 DIAGNOSIS — Z0000 Encounter for general adult medical examination without abnormal findings: Secondary | ICD-10-CM

## 2022-12-23 DIAGNOSIS — R739 Hyperglycemia, unspecified: Secondary | ICD-10-CM

## 2022-12-23 DIAGNOSIS — Z125 Encounter for screening for malignant neoplasm of prostate: Secondary | ICD-10-CM

## 2022-12-23 DIAGNOSIS — Z79899 Other long term (current) drug therapy: Secondary | ICD-10-CM

## 2022-12-23 DIAGNOSIS — R7309 Other abnormal glucose: Secondary | ICD-10-CM

## 2022-12-23 DIAGNOSIS — E7800 Pure hypercholesterolemia, unspecified: Secondary | ICD-10-CM

## 2022-12-23 NOTE — Telephone Encounter (Signed)
-----  Message from Velna Hatchet, RT sent at 12/08/2022  9:10 AM EST ----- Regarding: Wed1/19 lab Patient is scheduled for cpx, please order future labs.  Thanks, Anda Kraft

## 2022-12-24 ENCOUNTER — Other Ambulatory Visit (INDEPENDENT_AMBULATORY_CARE_PROVIDER_SITE_OTHER): Payer: 59

## 2022-12-24 DIAGNOSIS — R7309 Other abnormal glucose: Secondary | ICD-10-CM

## 2022-12-24 DIAGNOSIS — Z0000 Encounter for general adult medical examination without abnormal findings: Secondary | ICD-10-CM | POA: Diagnosis not present

## 2022-12-24 DIAGNOSIS — Z79899 Other long term (current) drug therapy: Secondary | ICD-10-CM | POA: Diagnosis not present

## 2022-12-24 DIAGNOSIS — Z125 Encounter for screening for malignant neoplasm of prostate: Secondary | ICD-10-CM | POA: Diagnosis not present

## 2022-12-24 DIAGNOSIS — R739 Hyperglycemia, unspecified: Secondary | ICD-10-CM

## 2022-12-24 DIAGNOSIS — E7800 Pure hypercholesterolemia, unspecified: Secondary | ICD-10-CM

## 2022-12-24 LAB — CBC WITH DIFFERENTIAL/PLATELET
Basophils Absolute: 0.1 10*3/uL (ref 0.0–0.1)
Basophils Relative: 1.2 % (ref 0.0–3.0)
Eosinophils Absolute: 0.4 10*3/uL (ref 0.0–0.7)
Eosinophils Relative: 7.5 % — ABNORMAL HIGH (ref 0.0–5.0)
HCT: 46.4 % (ref 39.0–52.0)
Hemoglobin: 15.9 g/dL (ref 13.0–17.0)
Lymphocytes Relative: 30.9 % (ref 12.0–46.0)
Lymphs Abs: 1.6 10*3/uL (ref 0.7–4.0)
MCHC: 34.2 g/dL (ref 30.0–36.0)
MCV: 92 fl (ref 78.0–100.0)
Monocytes Absolute: 0.6 10*3/uL (ref 0.1–1.0)
Monocytes Relative: 11.6 % (ref 3.0–12.0)
Neutro Abs: 2.5 10*3/uL (ref 1.4–7.7)
Neutrophils Relative %: 48.8 % (ref 43.0–77.0)
Platelets: 278 10*3/uL (ref 150.0–400.0)
RBC: 5.04 Mil/uL (ref 4.22–5.81)
RDW: 12.9 % (ref 11.5–15.5)
WBC: 5.1 10*3/uL (ref 4.0–10.5)

## 2022-12-24 LAB — LIPID PANEL
Cholesterol: 199 mg/dL (ref 0–200)
HDL: 41 mg/dL (ref >39.00)
NonHDL: 157.51
Total CHOL/HDL Ratio: 5
Triglycerides: 203 mg/dL — ABNORMAL HIGH (ref 0.0–149.0)
VLDL: 40.6 mg/dL — ABNORMAL HIGH (ref 0.0–40.0)

## 2022-12-24 LAB — COMPREHENSIVE METABOLIC PANEL
ALT: 21 U/L (ref 0–53)
AST: 16 U/L (ref 0–37)
Albumin: 4.4 g/dL (ref 3.5–5.2)
Alkaline Phosphatase: 75 U/L (ref 39–117)
BUN: 19 mg/dL (ref 6–23)
CO2: 29 mEq/L (ref 19–32)
Calcium: 9 mg/dL (ref 8.4–10.5)
Chloride: 102 mEq/L (ref 96–112)
Creatinine, Ser: 1.18 mg/dL (ref 0.40–1.50)
GFR: 72.42 mL/min (ref >60.00)
Glucose, Bld: 100 mg/dL — ABNORMAL HIGH (ref 70–99)
Potassium: 4.4 mEq/L (ref 3.5–5.1)
Sodium: 138 mEq/L (ref 135–145)
Total Bilirubin: 0.5 mg/dL (ref 0.2–1.2)
Total Protein: 7 g/dL (ref 6.0–8.3)

## 2022-12-24 LAB — VITAMIN B12: Vitamin B-12: 300 pg/mL (ref 211–911)

## 2022-12-24 LAB — HEMOGLOBIN A1C: Hgb A1c MFr Bld: 5.5 % (ref 4.6–6.5)

## 2022-12-24 LAB — TSH: TSH: 1.92 u[IU]/mL (ref 0.35–5.50)

## 2022-12-24 LAB — LDL CHOLESTEROL, DIRECT: Direct LDL: 136 mg/dL

## 2022-12-24 LAB — PSA: PSA: 0.2 ng/mL (ref 0.10–4.00)

## 2022-12-31 ENCOUNTER — Encounter: Payer: Self-pay | Admitting: Family Medicine

## 2022-12-31 ENCOUNTER — Ambulatory Visit (INDEPENDENT_AMBULATORY_CARE_PROVIDER_SITE_OTHER): Payer: 59 | Admitting: Family Medicine

## 2022-12-31 VITALS — BP 128/86 | HR 79 | Temp 97.5°F | Ht 73.0 in | Wt 231.2 lb

## 2022-12-31 DIAGNOSIS — Z125 Encounter for screening for malignant neoplasm of prostate: Secondary | ICD-10-CM

## 2022-12-31 DIAGNOSIS — Z79899 Other long term (current) drug therapy: Secondary | ICD-10-CM

## 2022-12-31 DIAGNOSIS — F4010 Social phobia, unspecified: Secondary | ICD-10-CM

## 2022-12-31 DIAGNOSIS — R69 Illness, unspecified: Secondary | ICD-10-CM | POA: Diagnosis not present

## 2022-12-31 DIAGNOSIS — Z1211 Encounter for screening for malignant neoplasm of colon: Secondary | ICD-10-CM

## 2022-12-31 DIAGNOSIS — Z0000 Encounter for general adult medical examination without abnormal findings: Secondary | ICD-10-CM | POA: Diagnosis not present

## 2022-12-31 DIAGNOSIS — E7800 Pure hypercholesterolemia, unspecified: Secondary | ICD-10-CM

## 2022-12-31 DIAGNOSIS — K219 Gastro-esophageal reflux disease without esophagitis: Secondary | ICD-10-CM | POA: Diagnosis not present

## 2022-12-31 DIAGNOSIS — R7309 Other abnormal glucose: Secondary | ICD-10-CM | POA: Diagnosis not present

## 2022-12-31 MED ORDER — TADALAFIL 20 MG PO TABS: ORAL_TABLET | ORAL | 11 refills | Status: DC

## 2022-12-31 NOTE — Progress Notes (Unsigned)
Subjective:    Patient ID: Mario Lawrence, male    DOB: [birth year], 52 y.o.   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   Not a lot of time to take care of himself  Has ups and downs  Still goes to the gym at least 3 d per week  Works outside and does heavy work on the weekends   Eating fairly healthy / ot perfect  Tends to eat once per day    Immunization History  Administered Date(s) Administered   Influenza,inj,Quad PF,6+ Mos 08/19/2014   Td 03/13/2010   Tdap 03/04/2016   Health Maintenance Due  Topic Date Due   HIV Screening  Never done   Hepatitis C Screening  Never done   COLONOSCOPY (Pts 45-43yrs Insurance coverage will need to be confirmed)  Never done   Colon cancer screening  Father had colon cancer  His insurance would not pay for it before   Referral is there He has to call and schedule now    Declines flu shot   Prostate health  Lab Results  Component Value Date   PSA 0.20 12/24/2022   PSA 0.22 02/24/2018   PSA 0.18 02/18/2017   No changes in urination  No nocturia   Takes cialis for ED   Mood  Social anxiety disorder  Doing way better overall  Happy about that   Zoloft 50 in am and 25 in pm Klonopin prn   GERD Protonix 40-he did cut back/wean  No longer takes it every day On average takes 4 per week    Tagamet prn -helps a lot   Watches diet for triggers  Sweet cereal is a big trigger  Avoids spicy things     Lab Results  Component Value Date   CREATININE 1.18 12/24/2022   BUN 19 12/24/2022   NA 138 12/24/2022   K 4.4 12/24/2022   CL 102 12/24/2022   CO2 29  12/24/2022     Lab Results  Component Value Date   VITAMINB12 300 12/24/2022  Takes 500 mcg b12 daily    Elevated random glucose Lab Results  Component Value Date   HGBA1C 5.5 12/24/2022   This is up from 5.1   Is mindful of his diet   His father may have been prediabetic    Hyperlipidemia Lab Results  Component Value Date   CHOL 199 12/24/2022   CHOL 206 (H) 11/25/2021   CHOL 190 10/05/2019   Lab Results  Component Value Date   HDL 41.00 12/24/2022   HDL 43.00 11/25/2021   HDL 35 (L) 10/05/2019   Lab Results  Component Value Date   LDLCALC 114 (H) 10/05/2019   LDLCALC 122 (H) 02/24/2018   LDLCALC 106 (H) 02/18/2017   Lab Results  Component Value Date   TRIG 203.0 (H) 12/24/2022   TRIG 205.0 (H) 11/25/2021   TRIG 304 (H) 10/05/2019   Lab Results  Component Value Date   CHOLHDL 5 12/24/2022   CHOLHDL 5 11/25/2021   CHOLHDL  5.4 (H) 10/05/2019   Lab Results  Component Value Date   LDLDIRECT 136.0 12/24/2022   LDLDIRECT 151.0 11/25/2021   LDLDIRECT 153.7 12/11/2009   Eats too much red meat Not a lot of other trans/sat fats    Other labs Lab Results  Component Value Date   WBC 5.1 12/24/2022   HGB 15.9 12/24/2022   HCT 46.4 12/24/2022   MCV 92.0 12/24/2022   PLT 278.0 12/24/2022   Lab Results  Component Value Date   TSH 1.92 12/24/2022   Lab Results  Component Value Date   ALT 21 12/24/2022   AST 16 12/24/2022   ALKPHOS 75 12/24/2022   BILITOT 0.5 12/24/2022     Review of Systems     Objective:   Physical Exam        Assessment & Plan:

## 2022-12-31 NOTE — Patient Instructions (Addendum)
Call the GI office and set up your colonoscopy  Don't wait on this   Keep up the exercise Try to get most of your carbohydrates from produce (with the exception of white potatoes)  Eat less bread/pasta/rice/snack foods/cereals/sweets and other items from the middle of the grocery store (processed carbs)    For cholesterol Eat less high cholesterol foods   Avoid red meat/ fried foods/ egg yolks/ fatty breakfast meats/ butter, cheese and high fat dairy/ and shellfish   Sub ground [place] for ground beef if you can

## 2023-01-02 NOTE — Assessment & Plan Note (Signed)
Doing well overall  Continues zoloft 50 in am and 25 in pm Prn klonopin

## 2023-01-02 NOTE — Assessment & Plan Note (Signed)
Disc goals for lipids and reasons to control them Rev last labs with pt Rev low sat fat diet in detail   Eats too much red meat  Glucose is borderline   Disc dietary change and will continue to monitor

## 2023-01-02 NOTE — Assessment & Plan Note (Signed)
Lab Results  Component Value Date   VITAMINB12 300 12/24/2022   Using ppi less now

## 2023-01-02 NOTE — Assessment & Plan Note (Signed)
Lab Results  Component Value Date   HGBA1C 5.5 12/24/2022   This is up from 5.1  Mindful of diet disc imp of low glycemic diet and wt loss to prevent DM2  Enc good exercise

## 2023-01-02 NOTE — Assessment & Plan Note (Signed)
Lab Results  Component Value Date   PSA 0.20 12/24/2022   PSA 0.22 02/24/2018   PSA 0.18 02/18/2017    No voiding changes or nocturia

## 2023-01-02 NOTE — Assessment & Plan Note (Signed)
Fatther had colon cancer  Up to now pt was not able to get insurance to cover a colonoscopy   Is able to get this now Referral done to GI and he will call to schedule it

## 2023-01-02 NOTE — Assessment & Plan Note (Signed)
No longer needs protonix every day  Watching diet

## 2023-01-02 NOTE — Assessment & Plan Note (Signed)
Reviewed health habits including diet and exercise and skin cancer prevention Reviewed appropriate screening tests for age  Also reviewed health mt list, fam hx and immunization status , as well as social and family history   See HPI Labs reviewed  Gi referral done for colonoscopy  Declines flu shot  Stable psa Good exercise habits  Enc use of sun protectoin

## 2023-01-31 ENCOUNTER — Other Ambulatory Visit: Payer: Self-pay | Admitting: Family Medicine

## 2023-02-01 NOTE — Telephone Encounter (Signed)
Name of Medication: [place] Name of Pharmacy: [place] or Written Date and Quantity: 11/03/22 #10 tab/ 0 refills Last Office Visit and Type: CPE 12/31/22 Next Office Visit and Type: none scheduled

## 2023-02-15 ENCOUNTER — Encounter: Payer: Self-pay | Admitting: Family Medicine

## 2023-02-15 DIAGNOSIS — Z1211 Encounter for screening for malignant neoplasm of colon: Secondary | ICD-10-CM

## 2023-02-16 ENCOUNTER — Encounter: Payer: Self-pay | Admitting: Gastroenterology

## 2023-02-25 ENCOUNTER — Encounter: Payer: Self-pay | Admitting: Gastroenterology

## 2023-02-25 ENCOUNTER — Ambulatory Visit (AMBULATORY_SURGERY_CENTER): Payer: 59 | Admitting: *Deleted

## 2023-02-25 VITALS — Ht 73.0 in | Wt 230.0 lb

## 2023-02-25 DIAGNOSIS — Z800 Family history of malignant neoplasm of digestive organs: Secondary | ICD-10-CM

## 2023-02-25 MED ORDER — NA SULFATE-K SULFATE-MG SULF 17.5-3.13-1.6 GM/177ML PO SOLN: 1.0000 | Freq: Once | ORAL | 0 refills | Status: AC

## 2023-02-25 NOTE — Progress Notes (Signed)

## 2023-03-16 ENCOUNTER — Other Ambulatory Visit: Payer: Self-pay | Admitting: Family Medicine

## 2023-03-28 ENCOUNTER — Telehealth: Payer: Self-pay | Admitting: Gastroenterology

## 2023-03-28 NOTE — Telephone Encounter (Signed)
Called and spoke with pharmacist- she reports the patient had not submitted his insurance card- Pharmacist reports the patient's co=pay for this medication is $0.00-  Called and spoke with patient-informed patient that he had not submitted his insurance information and that is why the cost way increased; informed patient that once insurance info submitted it will cost the patient $0.00 for the prep; Patient verbalized understanding of information/instructions;  Patient advised to call back to the office at [phone] should questions/concerns arise;

## 2023-03-28 NOTE — Telephone Encounter (Signed)
Patient is calling wanting to know if there are any cheaper alternatives for his prep medication. Please advise

## 2023-03-30 ENCOUNTER — Encounter: Payer: Self-pay | Admitting: Gastroenterology

## 2023-04-01 ENCOUNTER — Ambulatory Visit (AMBULATORY_SURGERY_CENTER): Payer: 59 | Admitting: Gastroenterology

## 2023-04-01 ENCOUNTER — Encounter: Payer: Self-pay | Admitting: Gastroenterology

## 2023-04-01 VITALS — BP 126/81 | HR 61 | Temp 97.8°F | Resp 15 | Ht 73.0 in | Wt 230.0 lb

## 2023-04-01 DIAGNOSIS — F419 Anxiety disorder, unspecified: Secondary | ICD-10-CM | POA: Diagnosis not present

## 2023-04-01 DIAGNOSIS — J45909 Unspecified asthma, uncomplicated: Secondary | ICD-10-CM | POA: Diagnosis not present

## 2023-04-01 DIAGNOSIS — Z800 Family history of malignant neoplasm of digestive organs: Secondary | ICD-10-CM

## 2023-04-01 DIAGNOSIS — Z1211 Encounter for screening for malignant neoplasm of colon: Secondary | ICD-10-CM

## 2023-04-01 MED ORDER — SODIUM CHLORIDE 0.9 % IV SOLN: 500.0000 mL | INTRAVENOUS | Status: DC

## 2023-04-01 NOTE — Patient Instructions (Addendum)
Recommendation: Patient has a contact number available for                            emergencies. The signs and symptoms of potential                            delayed complications were discussed with the                            patient. Return to normal activities tomorrow.                            Written discharge instructions were provided to the                            patient.                           - Resume previous diet.                           - Continue present medications.                           - Repeat colonoscopy in 5 years for surveillance.  Handouts on hemorrhoids given.   YOU HAD AN ENDOSCOPIC PROCEDURE TODAY AT THE [facility]:   Refer to the procedure report that was given to you for any specific questions about what was found during the examination.  If the procedure report does not answer your questions, please call your gastroenterologist to clarify.  If you requested that your care partner not be given the details of your procedure findings, then the procedure report has been included in a sealed envelope for you to review at your convenience later.  YOU SHOULD EXPECT: Some feelings of bloating in the abdomen. Passage of more gas than usual.  Walking can help get rid of the air that was put into your GI tract during the procedure and reduce the bloating. If you had a lower endoscopy (such as a colonoscopy or flexible sigmoidoscopy) you may notice spotting of blood in your stool or on the toilet paper. If you underwent a bowel prep for your procedure, you may not have a normal bowel movement for a few days.  Please Note:  You might notice some irritation and congestion in your nose or some drainage.  This is from the oxygen used during your procedure.  There is no need for concern and it should clear up in a day or so.  SYMPTOMS TO REPORT IMMEDIATELY:  Following lower endoscopy (colonoscopy or flexible sigmoidoscopy):  Excessive amounts of  blood in the stool  Significant tenderness or worsening of abdominal pains  Swelling of the abdomen that is new, acute  Fever of 100F or higher  For urgent or emergent issues, a gastroenterologist can be reached at any hour by calling [phone]. Do not use MyChart messaging for urgent concerns.    DIET:  We do recommend a small meal at first, but then you may proceed to your regular diet.  Drink plenty of fluids but you should avoid alcoholic beverages for 24 hours.  ACTIVITY:  You  should plan to take it easy for the rest of today and you should NOT DRIVE or use heavy machinery until tomorrow (because of the sedation medicines used during the test).    FOLLOW UP: Our staff will call the number listed on your records the next business day following your procedure.  We will call around 7:15- 8:00 am to check on you and address any questions or concerns that you may have regarding the information given to you following your procedure. If we do not reach you, we will leave a message.     If any biopsies were taken you will be contacted by phone or by letter within the next 1-3 weeks.  Please call us at [phone] if you have not heard about the biopsies in 3 weeks.    SIGNATURES/CONFIDENTIALITY: You and/or your care partner have signed paperwork which will be entered into your electronic medical record.  These signatures attest to the fact that that the information above on your After Visit Summary has been reviewed and is understood.  Full responsibility of the confidentiality of this discharge information lies with you and/or your care-partner.

## 2023-04-01 NOTE — Op Note (Signed)
[facility] Patient Name: Mario Lawrence Procedure Date: 04/01/2023 8:26 AM MRN: [id number] Endoscopist: Napoleon Form , MD, [id number] Age: 53 Referring MD:  Date of Birth: 13-Sep-1971 Gender: Male Account #: [id number] Procedure:                Colonoscopy Indications:              Screening in patient at increased risk: Colorectal                            cancer in father before age 40 Medicines:                Monitored Anesthesia Care Procedure:                After obtaining informed consent, the colonoscope                            was passed under direct vision. Throughout the                            procedure, the patient's blood pressure, pulse, and                            oxygen saturations were monitored continuously. The                            CF [id number] #[id number] was introduced through the anus                            and advanced to the the terminal ileum, with                            identification of the appendiceal orifice and IC                            valve. The colonoscopy was performed without                            difficulty. The patient tolerated the procedure                            well. The quality of the bowel preparation was                            good. The terminal ileum, ileocecal valve,                            appendiceal orifice, and rectum and the ileocecal                            valve, appendiceal orifice, and rectum were                            photographed. Scope In: 8:45:34 AM Scope Out: 9:02:11 AM Scope Withdrawal Time: 0 hours 11 minutes 36 seconds  Total  Procedure Duration: 0 hours 16 minutes 37 seconds  Findings:                 The perianal and digital rectal examinations were                            normal.                           Non-bleeding internal hemorrhoids were found during                            retroflexion. The hemorrhoids were medium-sized.                            The exam was otherwise without abnormality. Complications:            No immediate complications. Estimated Blood Loss:     Estimated blood loss was minimal. Impression:               - Non-bleeding internal hemorrhoids.                           - The examination was otherwise normal.                           - No specimens collected.                           - The GI Genius (intelligent endoscopy module),                            computer-aided polyp detection system powered by AI                            was utilized to detect colorectal polyps through                            enhanced visualization during colonoscopy. Recommendation:           - Patient has a contact number available for                            emergencies. The signs and symptoms of potential                            delayed complications were discussed with the                            patient. Return to normal activities tomorrow.                            Written discharge instructions were provided to the                            patient.                           -  Resume previous diet.                           - Continue present medications.                           - Repeat colonoscopy in 5 years for surveillance. Napoleon Form, MD 04/01/2023 9:16:11 AM This report has been signed electronically.

## 2023-04-01 NOTE — Progress Notes (Signed)
Pt. states no medical or surgical changes since previsit or office visit. 

## 2023-04-01 NOTE — Progress Notes (Signed)
[facility] History and Physical   Primary Care Physician:  Tower, Audrie Gallus, MD   Reason for Procedure:  Family history of colon cancer  Plan:    Screening colonoscopy with possible interventions as needed     HPI: Mario Lawrence is a very pleasant 52 y.o. male here for screening colonoscopy. Denies any nausea, vomiting, abdominal pain, melena or bright red blood per rectum  The risks and benefits as well as alternatives of endoscopic procedure(s) have been discussed and reviewed. All questions answered. The patient agrees to proceed.    Past Medical History:  Diagnosis Date   Allergy    Anxiety    Asthma    Dysphagia    ED (erectile dysfunction)    GERD (gastroesophageal reflux disease)    History of COVID-19 10/21/2020   Hyperlipidemia    Seasonal allergies     Past Surgical History:  Procedure Laterality Date   DISTAL BICEPS TENDON REPAIR Right 12/03/2020   Procedure: DISTAL BICEPS TENDON REPAIR;  Surgeon: Frederico Hamman, MD;  Location: [facility];  Service: Orthopedics;  Laterality: Right;    Prior to Admission medications   Medication Sig Start Date End Date Taking? Authorizing Provider  clonazePAM (KLONOPIN) 0.5 MG tablet TAKE 1/2 TABLET BY MOUTH DAILY AS NEEDED FOR ANXIETY 02/01/23  Yes Tower, Marne A, MD  pantoprazole (PROTONIX) 40 MG tablet TAKE 1 TABLET BY MOUTH ONE TO TWO TIMES A DAY AS NEEDED 03/16/23  Yes Tower, Audrie Gallus, MD  sertraline (ZOLOFT) 50 MG tablet Take 1 pill by mouth each am and 1/2 pill each pm 11/30/22  Yes Tower, Marne A, MD  albuterol (VENTOLIN HFA) 108 (90 Base) MCG/ACT inhaler Inhale 2 puffs into the lungs every 4 (four) hours as needed. 10/29/20   Tower, Audrie Gallus, MD  cimetidine (TAGAMET) 200 MG tablet Take 1 tablet (200 mg total) by mouth 2 (two) times daily as needed (heartburn). 07/21/22   Eustaquio Boyden, MD  diphenhydrAMINE (BENADRYL) 25 MG tablet Take 2 tablets (50 mg total) by mouth every 4 (four)  hours as needed for itching or allergies. 05/18/14   Mesner, Barbara Cower, MD  EPINEPHrine (EPIPEN) 0.3 mg/0.3 mL IJ SOAJ injection Inject 0.3 mLs (0.3 mg total) into the muscle as needed. Patient not taking: Reported on 02/25/2023 05/18/14   Mesner, Barbara Cower, MD  fluticasone [facility] 50 MCG/ACT nasal spray Place 2 sprays into both nostrils daily. Patient taking differently: Place 2 sprays into both nostrils daily. Taking PRN 03/03/18 02/25/23  Tower, Audrie Gallus, MD  tadalafil (CIALIS) 20 MG tablet TAKE ONE TABLET BY MOUTH DAILY AS NEEDED FOR ERECTILE DYSFUNCTION 12/31/22   Tower, Audrie Gallus, MD    Current Outpatient Medications  Medication Sig Dispense Refill   clonazePAM (KLONOPIN) 0.5 MG tablet TAKE 1/2 TABLET BY MOUTH DAILY AS NEEDED FOR ANXIETY 10 tablet 0   pantoprazole (PROTONIX) 40 MG tablet TAKE 1 TABLET BY MOUTH ONE TO TWO TIMES A DAY AS NEEDED 90 tablet 2   sertraline (ZOLOFT) 50 MG tablet Take 1 pill by mouth each am and 1/2 pill each pm 135 tablet 0   albuterol (VENTOLIN HFA) 108 (90 Base) MCG/ACT inhaler Inhale 2 puffs into the lungs every 4 (four) hours as needed. 1 each 3   cimetidine (TAGAMET) 200 MG tablet Take 1 tablet (200 mg total) by mouth 2 (two) times daily as needed (heartburn).     diphenhydrAMINE (BENADRYL) 25 MG tablet Take 2 tablets (50 mg total) by mouth every 4 (four)  hours as needed for itching or allergies. 20 tablet 0   EPINEPHrine (EPIPEN) 0.3 mg/0.3 mL IJ SOAJ injection Inject 0.3 mLs (0.3 mg total) into the muscle as needed. (Patient not taking: Reported on 02/25/2023) 1 Device 2   fluticasone (FLONASE) 50 MCG/ACT nasal spray Place 2 sprays into both nostrils daily. (Patient taking differently: Place 2 sprays into both nostrils daily. Taking PRN) 16 g 11   tadalafil (CIALIS) 20 MG tablet TAKE ONE TABLET BY MOUTH DAILY AS NEEDED FOR ERECTILE DYSFUNCTION 10 tablet 11   Current Facility-Administered Medications  Medication Dose Route Frequency Provider Last Rate Last Admin   0.9 %   sodium chloride infusion  500 mL Intravenous Continuous [place] Mccutchen, Eleonore Chiquito, MD        Allergies as of 04/01/2023 - Review Complete 04/01/2023  Allergen Reaction Noted   Shrimp [shellfish allergy] Hives and Shortness Of Breath 05/18/2014   Codeine Nausea Only 02/24/2011   Tramadol Other (See Comments) 11/25/2020    Family History  Problem Relation Age of Onset   Colon cancer Father        in his 35s   Ovarian cancer Maternal Grandmother    Lung cancer Maternal Grandfather    COPD Maternal Grandfather    Ovarian cancer Paternal Grandmother    Esophageal cancer Neg Hx    Stomach cancer Neg Hx    Rectal cancer Neg Hx     Social History   Socioeconomic History   Marital status: Married    Spouse name: Not on file   Number of children: 1   Years of education: Not on file   Highest education level: Not on file  Occupational History   Occupation: Works at [employer]  Tobacco Use   Smoking status: Former    Types: Cigarettes    Quit date: 12/06/2008    Years since quitting: 14.3   Smokeless tobacco: Never  Vaping Use   Vaping Use: Never used  Substance and Sexual Activity   Alcohol use: Yes    Alcohol/week: 0.0 standard drinks of alcohol    Comment: Occasional-weekends   Drug use: No   Sexual activity: Yes  Other Topics Concern   Not on file  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    Social Determinants of Health   Financial Resource Strain: Not on file  Food Insecurity: Not on file  Transportation Needs: Not on file  Physical Activity: Not on file  Stress: Not on file  Social Connections: Not on file  Intimate Partner Violence: Not on file    Review of Systems:  All other review of systems negative except as mentioned in the HPI.  Physical Exam: Vital signs in last 24 hours: Blood Pressure (Abnormal) 129/91   Pulse 62   Temperature 97.8 F (36.6 C) (Skin)   Respiration (Abnormal) 8   Height  6\' 1"  (1.854 m)   Weight 230 lb (104.3 kg)   Oxygen Saturation 98%   Body Mass Index 30.34 kg/m  General:   Alert, NAD Lungs:  Clear .   Heart:  Regular rate and rhythm Abdomen:  Soft, nontender and nondistended. Neuro/Psych:  Alert and cooperative. Normal mood and affect. A and O x 3  Reviewed labs, radiology imaging, old records and pertinent past GI work up  Patient is appropriate for planned procedure(s) and anesthesia in an ambulatory setting   K. Scherry Ran , MD [phone]

## 2023-04-01 NOTE — Progress Notes (Signed)
A and O x3. Report to RN. Tolerated MAC anesthesia well. 

## 2023-04-04 ENCOUNTER — Telehealth: Payer: Self-pay | Admitting: *Deleted

## 2023-04-04 NOTE — Telephone Encounter (Signed)
Follow up call attempt.  LVM to call with any questions or concerns. 

## 2023-04-20 ENCOUNTER — Other Ambulatory Visit: Payer: Self-pay | Admitting: Family Medicine

## 2023-06-08 ENCOUNTER — Ambulatory Visit: Payer: 59 | Admitting: Family Medicine

## 2023-07-21 ENCOUNTER — Other Ambulatory Visit: Payer: Self-pay | Admitting: Family Medicine

## 2023-07-21 NOTE — Telephone Encounter (Signed)
Name of Medication: Klonopin Name of Pharmacy: [facility] Last Fill or Written Date and Quantity: 02/01/23 #10 tab/ 0 refills Last Office Visit and Type: CPE 12/31/22 Next Office Visit and Type: none scheduled

## 2023-08-04 ENCOUNTER — Other Ambulatory Visit: Payer: Self-pay | Admitting: Family Medicine

## 2023-12-05 ENCOUNTER — Telehealth: Payer: Self-pay

## 2023-12-05 NOTE — Telephone Encounter (Signed)
I will see him then Agree with Er precautions

## 2023-12-05 NOTE — Telephone Encounter (Signed)
Copied from CRM [id number]. Topic: Clinical - Medical Advice >> Dec 05, 2023  9:05 AM Elizebeth Brooking wrote: Reason for CRM: Patient is feeling really bad has been schedule for tomorrow but would like to see if he could be seen sometime today. Patients wife is requesting a callback

## 2023-12-05 NOTE — Telephone Encounter (Addendum)
Home # no answer and v/m not activated; I spoke with pt; pt said 1 wk ago started with flu like symptoms; the flu symptoms are gone but pt is left with dry cough ,pt is not reating at night due to cough. no fever.pt has  SOB upon exertion, and wheezing. Pt had VV 12/03/23 and started on prednisone 20 mg taking 2 daily an d pt cannot see improvement. No  CP and pt is going to uses inhaler; pt has nebulizer and is drinking plenty of fluids. Pt already has appt with Dr Milinda Antis 12/06/23 at 12:30.UC & ED precautions given and pt voiced understanding. Sending note to Dr Milinda Antis who ios out of office and Dr Para March who is in office as FYI. Also sending to [employer]

## 2023-12-06 ENCOUNTER — Ambulatory Visit: Payer: 59 | Admitting: Family Medicine

## 2023-12-06 ENCOUNTER — Encounter: Payer: Self-pay | Admitting: Family Medicine

## 2023-12-06 VITALS — BP 122/84 | HR 63 | Temp 97.9°F | Ht 73.0 in | Wt 232.2 lb

## 2023-12-06 DIAGNOSIS — J209 Acute bronchitis, unspecified: Secondary | ICD-10-CM

## 2023-12-06 MED ORDER — PROMETHAZINE-DM 6.25-15 MG/5ML PO SYRP: 5.0000 mL | ORAL_SOLUTION | Freq: Four times a day (QID) | ORAL | 0 refills | Status: DC | PRN

## 2023-12-06 MED ORDER — PREDNISONE 20 MG PO TABS: 20.0000 mg | ORAL_TABLET | Freq: Every day | ORAL | 0 refills | Status: DC

## 2023-12-06 NOTE — Patient Instructions (Signed)
 Drink fluids and rest  mucinex DM is good for cough and congestion during the day  For more severe cough-switch to the prometh  dm I sent (caution of sedation)  Continue the tessalon  Once you finish the 40 mg prednisone , take the 20 mg daily for 5 days  Nasal saline for congestion as needed  Tylenol  for fever or pain or headache  Please alert us  if symptoms worsen (if severe or short of breath please go to the ER)

## 2023-12-06 NOTE — Telephone Encounter (Signed)
 Noted. Thanks.

## 2023-12-06 NOTE — Assessment & Plan Note (Signed)
 Started to improve finally today  Reviewed notes from his e visit , finishing 5 d course of prednisone  40  Reassuring exam Will prescribe 5 more days of prednisone  at 20 mg (watching for worse wheezing)  Continue albuterol  mdi and tessalon Sent prometh  dm for more severe cough See AVS for symptom care Update if not starting to improve in a week or if worsening  Call back and Er precautions noted in detail today

## 2023-12-06 NOTE — Progress Notes (Signed)
 Subjective:    Patient ID: Mario Lawrence, male    DOB: 01/12/1971, 52 y.o.   MRN: [id number]  HPI  Wt Readings from Last 3 Encounters:  12/06/23 232 lb 4 oz (105.3 kg)  04/01/23 230 lb (104.3 kg)  02/25/23 230 lb (104.3 kg)   30.64 kg/m  Vitals:   12/06/23 1224  BP: 122/84  Pulse: 63  Temp: 97.9 F (36.6 C)  SpO2: 97%   Pt presents with uri symptoms including cough and nasal congestion   Early on had fever-that got better   Now hoarse and severe cough but feels better than he did  Last night slept a little better   Wheezing is just starting to improve a little today  Phlegm - today is clear for the most part   Headache -worse with coughing  No ST  Ears are clear   Sinus congestion never got that bad   Never did a covid    [facility] 5 d of symptoms  Dx with bronchitis Prescription  Prednisone  40 mg daily for 5 days Tessalon Albuterol  mdi - several times per day (when lying down)  Tylenol   Former smoker Quit in 2020 As history of mild asthma    Patient Active Problem List   Diagnosis Date Noted   Current use of proton pump inhibitor 11/25/2021   Performance anxiety 06/05/2018   Elevated random blood glucose level 03/03/2018   Social anxiety disorder 03/03/2018   Prostate cancer screening 02/13/2017   Colon cancer screening 08/19/2014   Family history of colon cancer 05/15/2013   Routine general medical examination at a health care facility 03/25/2011   Seasonal allergies    Acute bronchitis 10/28/2010   Hyperlipidemia 03/13/2010   ED (erectile dysfunction) 12/11/2009   ASTHMA 12/11/2009   GERD 12/11/2009   Past Medical History:  Diagnosis Date   Allergy    Anxiety    Asthma    Dysphagia    ED (erectile dysfunction)    GERD (gastroesophageal reflux disease)    History of COVID-19 10/21/2020   Hyperlipidemia    Seasonal allergies    Past Surgical History:  Procedure Laterality Date   DISTAL  BICEPS TENDON REPAIR Right 12/03/2020   Procedure: DISTAL BICEPS TENDON REPAIR;  Surgeon: Shari Sieving, MD;  Location: [facility];  Service: Orthopedics;  Laterality: Right;   Social History   Tobacco Use   Smoking status: Former    Current packs/day: 0.00    Types: Cigarettes    Quit date: 12/06/2008    Years since quitting: 15.0   Smokeless tobacco: Never  Vaping Use   Vaping status: Never Used  Substance Use Topics   Alcohol use: Yes    Alcohol/week: 0.0 standard drinks of alcohol    Comment: Occasional-weekends   Drug use: No   Family History  Problem Relation Age of Onset   Colon cancer Father        in his 74s   Ovarian cancer Maternal Grandmother    Lung cancer Maternal Grandfather    COPD Maternal Grandfather    Ovarian cancer Paternal Grandmother    Esophageal cancer Neg Hx    Stomach cancer Neg Hx    Rectal cancer Neg Hx    Allergies  Allergen Reactions   Shrimp [Shellfish Allergy] Hives and Shortness Of Breath   Codeine Nausea Only   Tramadol Other (See Comments)    Does likes like how it makes him  feel   Current Outpatient Medications on File Prior to Visit  Medication Sig Dispense Refill   albuterol  (VENTOLIN  HFA) 108 (90 Base) MCG/ACT inhaler Inhale 2 puffs into the lungs every 4 (four) hours as needed. 1 each 3   benzonatate (TESSALON) 100 MG capsule 100 mg 3 (three) times daily as needed.     cimetidine  (TAGAMET ) 200 MG tablet Take 1 tablet (200 mg total) by mouth 2 (two) times daily as needed (heartburn).     clonazePAM  (KLONOPIN ) 0.5 MG tablet TAKE 1/2 TABLET BY MOUTH DAILY AS NEEDED FOR ANXIETY 10 tablet 0   diphenhydrAMINE  (BENADRYL ) 25 MG tablet Take 2 tablets (50 mg total) by mouth every 4 (four) hours as needed for itching or allergies. 20 tablet 0   EPINEPHrine  (EPIPEN ) 0.3 mg/0.3 mL IJ SOAJ injection Inject 0.3 mLs (0.3 mg total) into the muscle as needed. 1 Device 2   pantoprazole  (PROTONIX ) 40 MG tablet TAKE 1 TABLET BY MOUTH  1-2 TIMES A DAY AS NEEDED 180 tablet 1   predniSONE  (DELTASONE ) 20 MG tablet Take 40 mg by mouth daily.     sertraline  (ZOLOFT ) 50 MG tablet TAKE 1 TABLET BY MOUTH IN THE MORNING AND TAKE 1/2 TABLET BY MOUTH IN THE EVENING 135 tablet 1   tadalafil  (CIALIS ) 20 MG tablet TAKE ONE TABLET BY MOUTH DAILY AS NEEDED FOR ERECTILE DYSFUNCTION 10 tablet 11   fluticasone  (FLONASE ) 50 MCG/ACT nasal spray Place 2 sprays into both nostrils daily. (Patient taking differently: Place 2 sprays into both nostrils daily. Taking PRN) 16 g 11   No current facility-administered medications on file prior to visit.    Review of Systems  Constitutional:  Positive for appetite change and fatigue. Negative for fever.  HENT:  Positive for congestion, postnasal drip, rhinorrhea, sinus pressure, sneezing and sore throat. Negative for ear pain.   Eyes:  Negative for pain and discharge.  Respiratory:  Positive for cough, chest tightness and wheezing. Negative for shortness of breath and stridor.   Cardiovascular:  Negative for chest pain.  Gastrointestinal:  Negative for diarrhea, nausea and vomiting.  Genitourinary:  Negative for frequency, hematuria and urgency.  Musculoskeletal:  Negative for arthralgias and myalgias.  Skin:  Negative for rash.  Neurological:  Positive for headaches. Negative for dizziness, weakness and light-headedness.  Psychiatric/Behavioral:  Negative for confusion and dysphoric mood.        Objective:   Physical Exam Constitutional:      General: He is not in acute distress.    Appearance: Normal appearance. He is well-developed. He is not ill-appearing, toxic-appearing or diaphoretic.     Comments: Overweight  HENT:     Head: Normocephalic and atraumatic.     Comments: Nares are injected and congested    No sinus tenderness     Right Ear: Tympanic membrane, ear canal and external ear normal.     Left Ear: Tympanic membrane, ear canal and external ear normal.     Nose: Congestion and  rhinorrhea present.     Mouth/Throat:     Mouth: Mucous membranes are moist.     Pharynx: Oropharynx is clear. No oropharyngeal exudate or posterior oropharyngeal erythema.     Comments: Clear pnd  Eyes:     General:        Right eye: No discharge.        Left eye: No discharge.     Conjunctiva/sclera: Conjunctivae normal.     Pupils: Pupils are equal, round, and reactive to light.  Cardiovascular:     Rate and Rhythm: Normal rate.     Heart sounds: Normal heart sounds.  Pulmonary:     Effort: Pulmonary effort is normal. No respiratory distress.     Breath sounds: No stridor. Wheezing and rhonchi present. No rales.     Comments: Good air exch Wheeze only on forced exp Few scattered rhonchi  Chest:     Chest wall: No tenderness.  Musculoskeletal:     Cervical back: Normal range of motion and neck supple.  Lymphadenopathy:     Cervical: No cervical adenopathy.  Skin:    General: Skin is warm and dry.     Capillary Refill: Capillary refill takes less than 2 seconds.     Findings: No rash.  Neurological:     Mental Status: He is alert.     Cranial Nerves: No cranial nerve deficit.  Psychiatric:        Mood and Affect: Mood normal.           Assessment & Plan:   Problem List Items Addressed This Visit       Respiratory   Acute bronchitis - Primary   Started to improve finally today  Reviewed notes from his e visit , finishing 5 d course of prednisone  40  Reassuring exam Will prescribe 5 more days of prednisone  at 20 mg (watching for worse wheezing)  Continue albuterol  mdi and tessalon Sent prometh  dm for more severe cough See AVS for symptom care Update if not starting to improve in a week or if worsening  Call back and Er precautions noted in detail today

## 2023-12-07 DIAGNOSIS — M1990 Unspecified osteoarthritis, unspecified site: Secondary | ICD-10-CM

## 2023-12-07 HISTORY — DX: Unspecified osteoarthritis, unspecified site: M19.90

## 2024-01-27 ENCOUNTER — Other Ambulatory Visit: Payer: Self-pay | Admitting: Family Medicine

## 2024-01-30 NOTE — Telephone Encounter (Signed)
 Pt had a recent acute appt but last CPE was 12/31/22, no future appts

## 2024-01-30 NOTE — Telephone Encounter (Signed)
 I refilled one 3 mo supply   Please schedule annual exam in the next 3 months

## 2024-01-30 NOTE — Telephone Encounter (Signed)
 Not able to lvm due to vm not setup.

## 2024-02-05 ENCOUNTER — Other Ambulatory Visit: Payer: Self-pay | Admitting: Family Medicine

## 2024-02-07 NOTE — Telephone Encounter (Signed)
 Patient is overdue for CPE. Please call and schedule then route back for refill.  Thanks!

## 2024-02-07 NOTE — Telephone Encounter (Signed)
 Contacted pt, vm box isn't accepting messages.

## 2024-02-09 NOTE — Telephone Encounter (Signed)
 Lvmtcb, sent mychart message

## 2024-02-10 NOTE — Telephone Encounter (Signed)
 lvmtcb

## 2024-02-13 NOTE — Telephone Encounter (Signed)
 See prev messages, last CPE was 12/31/22 (pt did have a recent sick/acute appt) but is overdue for CPE. Multiple attempts have been made to reach pt to get appt schedule with no luck. Please advise   Last filled on 07/21/23 #10 tabs/ 0 refills

## 2024-02-13 NOTE — Telephone Encounter (Signed)
 I cannot refill until they get appointment scheduled  Thanks for letting me know

## 2024-02-23 ENCOUNTER — Other Ambulatory Visit: Payer: Self-pay | Admitting: Family Medicine

## 2024-02-23 NOTE — Telephone Encounter (Signed)
 Pt is overdue for his CPE (labs prior) please schedule appts and then route back to me. Thanks

## 2024-02-23 NOTE — Telephone Encounter (Signed)
 Called pt. No answer and Unable to leave voicemail.

## 2024-02-24 NOTE — Telephone Encounter (Signed)
 Vm box hasn't been set up yet, sent mychart message

## 2024-02-27 NOTE — Telephone Encounter (Signed)
 See prev messages, schedulers have not been able to schedule his CPE this year. He did have a recent acute appt. But last CPE was on 12/31/22 and no future appts, please advise

## 2024-02-27 NOTE — Telephone Encounter (Signed)
 Vm box is not set up, couldn't leave vm

## 2024-02-27 NOTE — Telephone Encounter (Signed)
 Don't refill  I can refill once he schedules  his appointment

## 2024-03-08 ENCOUNTER — Other Ambulatory Visit: Payer: Self-pay | Admitting: Family Medicine

## 2024-03-13 ENCOUNTER — Encounter: Payer: Self-pay | Admitting: Family Medicine

## 2024-03-13 ENCOUNTER — Ambulatory Visit (INDEPENDENT_AMBULATORY_CARE_PROVIDER_SITE_OTHER): Admitting: Family Medicine

## 2024-03-13 VITALS — BP 122/85 | HR 53 | Temp 97.8°F | Ht 73.0 in | Wt 235.4 lb

## 2024-03-13 DIAGNOSIS — R739 Hyperglycemia, unspecified: Secondary | ICD-10-CM

## 2024-03-13 DIAGNOSIS — Z125 Encounter for screening for malignant neoplasm of prostate: Secondary | ICD-10-CM

## 2024-03-13 DIAGNOSIS — N529 Male erectile dysfunction, unspecified: Secondary | ICD-10-CM

## 2024-03-13 DIAGNOSIS — F418 Other specified anxiety disorders: Secondary | ICD-10-CM

## 2024-03-13 DIAGNOSIS — Z0000 Encounter for general adult medical examination without abnormal findings: Secondary | ICD-10-CM

## 2024-03-13 DIAGNOSIS — K219 Gastro-esophageal reflux disease without esophagitis: Secondary | ICD-10-CM

## 2024-03-13 DIAGNOSIS — E7800 Pure hypercholesterolemia, unspecified: Secondary | ICD-10-CM | POA: Diagnosis not present

## 2024-03-13 DIAGNOSIS — Z79899 Other long term (current) drug therapy: Secondary | ICD-10-CM | POA: Diagnosis not present

## 2024-03-13 DIAGNOSIS — Z1211 Encounter for screening for malignant neoplasm of colon: Secondary | ICD-10-CM

## 2024-03-13 DIAGNOSIS — J4520 Mild intermittent asthma, uncomplicated: Secondary | ICD-10-CM

## 2024-03-13 DIAGNOSIS — F4010 Social phobia, unspecified: Secondary | ICD-10-CM

## 2024-03-13 DIAGNOSIS — J302 Other seasonal allergic rhinitis: Secondary | ICD-10-CM

## 2024-03-13 MED ORDER — CLONAZEPAM 0.5 MG PO TABS: ORAL_TABLET | ORAL | 0 refills | Status: DC

## 2024-03-13 MED ORDER — TADALAFIL 20 MG PO TABS: ORAL_TABLET | ORAL | 11 refills | Status: AC

## 2024-03-13 NOTE — Patient Instructions (Addendum)
 Consider taking vitamin D3 over the counter 2000 international units daily for bone health   Labs today   Take care of yourself  Keep exercising  Stay hydrated  Wear sun protection

## 2024-03-13 NOTE — Progress Notes (Signed)
 Subjective:    Patient ID: Mario Lawrence, male    DOB: 12-15-1970, 53 y.o.   MRN: [id number]  HPI  Here for health maintenance exam and to review chronic medical problems   Wt Readings from Last 3 Encounters:  03/13/24 235 lb 6 oz (106.8 kg)  12/06/23 232 lb 4 oz (105.3 kg)  04/01/23 230 lb (104.3 kg)   31.05 kg/m  Vitals:   03/13/24 1357 03/13/24 1420  BP: (!) 136/90 122/85  Pulse: (!) 53   Temp: 97.8 F (36.6 C)   SpO2: 99%     Immunization History  Administered Date(s) Administered   Influenza,inj,Quad PF,6+ Mos 08/19/2014   Td 03/13/2010   Tdap 03/04/2016    Health Maintenance Due  Topic Date Due   Pneumococcal Vaccine 39-29 Years old (1 of 2 - PCV) Never done   Feeling good  Taking care of himself   Shingrix - declines   Hep C/HIV screening -declines    Prostate health No urinary changes at all No nocturia  No fam history  Lab Results  Component Value Date   PSA 0.20 12/24/2022   PSA 0.22 02/24/2018   PSA 0.18 02/18/2017   ED Cialis prn    Colon cancer screening  colonoscopy 03/2023 with 5 y recall Father had colon cancer    Bone health   Falls: none  Fractures: none  Supplements : none    Exercise  Going to gym more  Planting a big flow field - really enjoys that   No dermatologist  No new skin lesions Uses sun protection     Mood    03/13/2024    2:03 PM 12/06/2023   12:31 PM 12/31/2022   10:55 AM 03/03/2018    3:09 PM 05/15/2013   10:13 AM  Depression screen PHQ 2/9  Decreased Interest 0 0 0 0 0  Down, Depressed, Hopeless 0 0 0 0 0  PHQ - 2 Score 0 0 0 0 0  Altered sleeping 0 0 0    Tired, decreased energy 1 0 0    Change in appetite 0 0 0    Feeling bad or failure about yourself  0 0 0    Trouble concentrating 0 0 0    Moving slowly or fidgety/restless 0 0 0    Suicidal thoughts 0 0 0    PHQ-9 Score 1 0 0    Difficult doing work/chores Not difficult at all Not difficult at all Not difficult at all         03/13/2024    2:03 PM 12/31/2022   10:55 AM  GAD 7 : Generalized Anxiety Score  Nervous, Anxious, on Edge 0 1  Control/stop worrying 0 0  Worry too much - different things 0 0  Trouble relaxing 0 0  Restless 0 1  Easily annoyed or irritable 0 0  Afraid - awful might happen 0 0  Total GAD 7 Score 0 2  Anxiety Difficulty Not difficult at all Not difficult at all     Takes klonopin prn for performance anxiety (beta blocker did not work) -uses very infrequently now  [employer] 50 am/ 25 pm for social anxiety disorder -this still works very well for him and wants to stick with it    Seasonal allergies Flonase - daily this time of year   GERD-doing better  Protonix 40 mg - takes as needed/ few times per week some of the time     Lab Results  Component Value Date   VITAMINB12 300 12/24/2022   Glucose-random/elevated in past  Lab Results  Component Value Date   HGBA1C 5.5 12/24/2022   HGBA1C 5.1 10/05/2019    Hyperlipidemia Lab Results  Component Value Date   CHOL 199 12/24/2022   HDL 41.00 12/24/2022   LDLCALC 114 (H) 10/05/2019   LDLDIRECT 136.0 12/24/2022   TRIG 203.0 (H) 12/24/2022   CHOLHDL 5 12/24/2022    Diet is about the same  Avoids fried foods most of the time  More chicken than beef   Eats lots of protein/some plant based      Patient Active Problem List   Diagnosis Date Noted   Current use of proton pump inhibitor 11/25/2021   Performance anxiety 06/05/2018   Elevated random blood glucose level 03/03/2018   Social anxiety disorder 03/03/2018   Prostate cancer screening 02/13/2017   Colon cancer screening 08/19/2014   Family history of colon cancer 05/15/2013   Routine general medical examination at a health care facility 03/25/2011   Seasonal allergies    Hyperlipidemia 03/13/2010   ED (erectile dysfunction) 12/11/2009   Asthma 12/11/2009   GERD 12/11/2009   Past Medical History:  Diagnosis Date   Allergy    Anxiety    Asthma     Dysphagia    ED (erectile dysfunction)    GERD (gastroesophageal reflux disease)    History of COVID-19 10/21/2020   Hyperlipidemia    Seasonal allergies    Past Surgical History:  Procedure Laterality Date   DISTAL BICEPS TENDON REPAIR Right 12/03/2020   Procedure: DISTAL BICEPS TENDON REPAIR;  Surgeon: Frederico Hamman, MD;  Location: [facility];  Service: Orthopedics;  Laterality: Right;   Social History   Tobacco Use   Smoking status: Former    Current packs/day: 0.00    Types: Cigarettes    Quit date: 12/06/2008    Years since quitting: 15.2   Smokeless tobacco: Never  Vaping Use   Vaping status: Never Used  Substance Use Topics   Alcohol use: Yes    Alcohol/week: 0.0 standard drinks of alcohol    Comment: Occasional-weekends   Drug use: No   Family History  Problem Relation Age of Onset   Colon cancer Father        in his 44s   Ovarian cancer Maternal Grandmother    Lung cancer Maternal Grandfather    COPD Maternal Grandfather    Ovarian cancer Paternal Grandmother    Esophageal cancer Neg Hx    Stomach cancer Neg Hx    Rectal cancer Neg Hx    Allergies  Allergen Reactions   Shrimp [Shellfish Allergy] Hives and Shortness Of Breath   Codeine Nausea Only   Tramadol Other (See Comments)    Does likes like how it makes him feel   Current Outpatient Medications on File Prior to Visit  Medication Sig Dispense Refill   albuterol (VENTOLIN HFA) 108 (90 Base) MCG/ACT inhaler Inhale 2 puffs into the lungs every 4 (four) hours as needed. 1 each 3   cimetidine (TAGAMET) 200 MG tablet Take 1 tablet (200 mg total) by mouth 2 (two) times daily as needed (heartburn).     diphenhydrAMINE (BENADRYL) 25 MG tablet Take 2 tablets (50 mg total) by mouth every 4 (four) hours as needed for itching or allergies. 20 tablet 0   EPINEPHrine (EPIPEN) 0.3 mg/0.3 mL IJ SOAJ injection Inject 0.3 mLs (0.3 mg total) into the muscle as needed. 1 Device 2  pantoprazole (PROTONIX)  40 MG tablet TAKE 1 TABLET BY MOUTH 1-2 TIMES A DAY AS NEEDED 180 tablet 1   sertraline (ZOLOFT) 50 MG tablet TAKE 1 TABLET BY MOUTH EVERY MORNING AND TAKE 1/2 TABLET BY MOUTH EVERY EVENING 135 tablet 0   fluticasone (FLONASE) 50 MCG/ACT nasal spray Place 2 sprays into both nostrils daily. (Patient taking differently: Place 2 sprays into both nostrils daily. Taking PRN) 16 g 11   No current facility-administered medications on file prior to visit.    Review of Systems  Constitutional:  Negative for activity change, appetite change, fatigue, fever and unexpected weight change.  HENT:  Negative for congestion, rhinorrhea, sore throat and trouble swallowing.   Eyes:  Negative for pain, redness, itching and visual disturbance.  Respiratory:  Negative for cough, chest tightness, shortness of breath and wheezing.   Cardiovascular:  Negative for chest pain and palpitations.  Gastrointestinal:  Negative for abdominal pain, blood in stool, constipation, diarrhea and nausea.  Endocrine: Negative for cold intolerance, heat intolerance, polydipsia and polyuria.  Genitourinary:  Negative for difficulty urinating, dysuria, frequency and urgency.  Musculoskeletal:  Negative for arthralgias, joint swelling and myalgias.  Skin:  Negative for pallor and rash.  Neurological:  Negative for dizziness, tremors, weakness, numbness and headaches.  Hematological:  Negative for adenopathy. Does not bruise/bleed easily.  Psychiatric/Behavioral:  Negative for decreased concentration, dysphoric mood and sleep disturbance. The patient is nervous/anxious.        Social anxiety is well controlled  Occational needs klonopin for public speaking        Objective:   Physical Exam Constitutional:      General: He is not in acute distress.    Appearance: Normal appearance. He is well-developed. He is not ill-appearing or diaphoretic.     Comments: Muscular build  Bmi does not represent obesity   HENT:     Head:  Normocephalic and atraumatic.     Right Ear: Tympanic membrane, ear canal and external ear normal.     Left Ear: Tympanic membrane, ear canal and external ear normal.     Nose: Nose normal. No congestion.     Mouth/Throat:     Mouth: Mucous membranes are moist.     Pharynx: Oropharynx is clear. No posterior oropharyngeal erythema.  Eyes:     General: No scleral icterus.       Right eye: No discharge.        Left eye: No discharge.     Conjunctiva/sclera: Conjunctivae normal.     Pupils: Pupils are equal, round, and reactive to light.  Neck:     Thyroid: No thyromegaly.     Vascular: No carotid bruit or JVD.  Cardiovascular:     Rate and Rhythm: Normal rate and regular rhythm.     Pulses: Normal pulses.     Heart sounds: Normal heart sounds.     No gallop.  Pulmonary:     Effort: Pulmonary effort is normal. No respiratory distress.     Breath sounds: Normal breath sounds. No wheezing or rales.     Comments: Good air exch Chest:     Chest wall: No tenderness.  Abdominal:     General: Bowel sounds are normal. There is no distension or abdominal bruit.     Palpations: Abdomen is soft. There is no mass.     Tenderness: There is no abdominal tenderness.     Hernia: No hernia is present.  Musculoskeletal:  General: No tenderness.     Cervical back: Normal range of motion and neck supple. No rigidity. No muscular tenderness.     Right lower leg: No edema.     Left lower leg: No edema.  Lymphadenopathy:     Cervical: No cervical adenopathy.  Skin:    General: Skin is warm and dry.     Coloration: Skin is not pale.     Findings: No erythema or rash.     Comments: Solar lentigines diffusely   Neurological:     Mental Status: He is alert.     Cranial Nerves: No cranial nerve deficit.     Motor: No abnormal muscle tone.     Coordination: Coordination normal.     Gait: Gait normal.     Deep Tendon Reflexes: Reflexes are normal and symmetric. Reflexes normal.  Psychiatric:         Attention and Perception: Attention normal.        Mood and Affect: Mood normal.        Cognition and Memory: Cognition and memory normal.     Comments: Good mood            Assessment & Plan:   Problem List Items Addressed This Visit       Respiratory   Asthma (Chronic)   Stable  No problems lately if not sick  Uses albuterol prn         Digestive   GERD (Chronic)   Takes protonix prn- as little as twice weekly at times Watches diet for triggers          Other   Social anxiety disorder   Continues sertraline 50 mg in am and 20 in pm  Works well for him  Wants to continue this   Encouraged continued self care including exercise         Seasonal allergies   Uses flonase daily in season  / now       Routine general medical examination at a health care facility - Primary   Reviewed health habits including diet and exercise and skin cancer prevention Reviewed appropriate screening tests for age  Also reviewed health mt list, fam hx and immunization status , as well as social and family history   See HPI Labs reviewed and ordered Health Maintenance  Topic Date Due   Pneumococcal Vaccination (1 of 2 - PCV) Never done   Hepatitis C Screening  03/13/2025*   HIV Screening  03/13/2025*   COVID-19 Vaccine (1) 03/29/2025*   Zoster (Shingles) Vaccine (1 of 2) 06/12/2025*   Flu Shot  07/06/2024   DTaP/Tdap/Td vaccine (3 - Td or Tdap) 03/04/2026   Colon Cancer Screening  03/31/2028   HPV Vaccine  Aged Out  *Topic was postponed. The date shown is not the original due date.    Declines hep C or HIV screening  Psa added for prostate screen  Discussed fall prevention, supplements and exercise for bone density  Discussed use of sun protection PHQ 0         Relevant Orders   TSH   Lipid panel   Comprehensive metabolic panel with GFR   CBC with Differential/Platelet   Prostate cancer screening   Psa ordered  No voiding changes No nocturia  No  family history       Relevant Orders   PSA   Performance anxiety   Klonopin 0.25 mg prn  Does not use often Last filled 10 pills in aug  Refilled today      Hyperlipidemia   Disc goals for lipids and reasons to control them Rev last labs with pt Rev low sat fat diet in detail  Labs today  Eats more chicken than beef  Continues to avoid fried food most of the time       Relevant Medications   tadalafil (CIALIS) 20 MG tablet   Other Relevant Orders   Lipid panel   Comprehensive metabolic panel with GFR   Elevated random blood glucose level   A1c ordered today  Exercising more and eating well   disc imp of low glycemic diet and wt mt to prevent DM2       Relevant Orders   Hemoglobin A1c   ED (erectile dysfunction)   Takes cialis 20 mg prn  Works well No side effects  Overall stable       Current use of proton pump inhibitor   B12 and D levels added to labs Recommend daily vitamin D3       Relevant Orders   Vitamin B12   VITAMIN D 25 Hydroxy (Vit-D Deficiency, Fractures)   Colon cancer screening   Colonoscopy 03/2023  Father had colon cancer 5 year recall  No stool changes

## 2024-03-13 NOTE — Assessment & Plan Note (Signed)
 Klonopin 0.25 mg prn  Does not use often Last filled 10 pills in aug   Refilled today

## 2024-03-13 NOTE — Assessment & Plan Note (Signed)
 A1c ordered today  Exercising more and eating well   disc imp of low glycemic diet and wt mt to prevent DM2

## 2024-03-13 NOTE — Assessment & Plan Note (Signed)
 Takes protonix prn- as little as twice weekly at times [employer] for triggers

## 2024-03-13 NOTE — Assessment & Plan Note (Signed)
 Disc goals for lipids and reasons to control them Rev last labs with pt Rev low sat fat diet in detail  Labs today  Eats more chicken than beef  Continues to avoid fried food most of the time

## 2024-03-13 NOTE — Assessment & Plan Note (Signed)
 Continues sertraline 50 mg in am and 20 in pm  Works well for him  Wants to continue this   Encouraged continued self care including exercise

## 2024-03-13 NOTE — Assessment & Plan Note (Signed)
 Colonoscopy 03/2023  Father had colon cancer 5 year recall  No stool changes

## 2024-03-13 NOTE — Assessment & Plan Note (Signed)
 Psa ordered  No voiding changes No nocturia  No family history

## 2024-03-13 NOTE — Assessment & Plan Note (Signed)
 Stable  No problems lately if not sick  Uses albuterol prn

## 2024-03-13 NOTE — Assessment & Plan Note (Signed)
 B12 and D levels added to labs Recommend daily vitamin D3

## 2024-03-13 NOTE — Assessment & Plan Note (Signed)
 Reviewed health habits including diet and exercise and skin cancer prevention Reviewed appropriate screening tests for age  Also reviewed health mt list, fam hx and immunization status , as well as social and family history   See HPI Labs reviewed and ordered Health Maintenance  Topic Date Due   Pneumococcal Vaccination (1 of 2 - PCV) Never done   Hepatitis C Screening  03/13/2025*   HIV Screening  03/13/2025*   COVID-19 Vaccine (1) 03/29/2025*   Zoster (Shingles) Vaccine (1 of 2) 06/12/2025*   Flu Shot  07/06/2024   DTaP/Tdap/Td vaccine (3 - Td or Tdap) 03/04/2026   Colon Cancer Screening  03/31/2028   HPV Vaccine  Aged Out  *Topic was postponed. The date shown is not the original due date.    Declines hep C or HIV screening  Psa added for prostate screen  Discussed fall prevention, supplements and exercise for bone density  Discussed use of sun protection PHQ 0

## 2024-03-13 NOTE — Assessment & Plan Note (Signed)
 Takes cialis 20 mg prn  Works well No side effects  Overall stable

## 2024-03-13 NOTE — Assessment & Plan Note (Signed)
 Uses flonase daily in season  / now

## 2024-03-14 ENCOUNTER — Encounter: Payer: Self-pay | Admitting: Family Medicine

## 2024-03-14 LAB — CBC WITH DIFFERENTIAL/PLATELET
Basophils Absolute: 0 10*3/uL (ref 0.0–0.1)
Basophils Relative: 0.9 % (ref 0.0–3.0)
Eosinophils Absolute: 0.5 10*3/uL (ref 0.0–0.7)
Eosinophils Relative: 9.5 % — ABNORMAL HIGH (ref 0.0–5.0)
HCT: 46.9 % (ref 39.0–52.0)
Hemoglobin: 16.2 g/dL (ref 13.0–17.0)
Lymphocytes Relative: 34.9 % (ref 12.0–46.0)
Lymphs Abs: 1.7 10*3/uL (ref 0.7–4.0)
MCHC: 34.6 g/dL (ref 30.0–36.0)
MCV: 92.3 fl (ref 78.0–100.0)
Monocytes Absolute: 0.6 10*3/uL (ref 0.1–1.0)
Monocytes Relative: 11.5 % (ref 3.0–12.0)
Neutro Abs: 2.2 10*3/uL (ref 1.4–7.7)
Neutrophils Relative %: 43.2 % (ref 43.0–77.0)
Platelets: 277 10*3/uL (ref 150.0–400.0)
RBC: 5.09 Mil/uL (ref 4.22–5.81)
RDW: 13.3 % (ref 11.5–15.5)
WBC: 5 10*3/uL (ref 4.0–10.5)

## 2024-03-14 LAB — LIPID PANEL
Cholesterol: 241 mg/dL — ABNORMAL HIGH (ref 0–200)
HDL: 49.8 mg/dL (ref >39.00)
LDL Cholesterol: 159 mg/dL — ABNORMAL HIGH (ref 0–99)
NonHDL: 190.91
Total CHOL/HDL Ratio: 5
Triglycerides: 158 mg/dL — ABNORMAL HIGH (ref 0.0–149.0)
VLDL: 31.6 mg/dL (ref 0.0–40.0)

## 2024-03-14 LAB — HEMOGLOBIN A1C: Hgb A1c MFr Bld: 5.3 % (ref 4.6–6.5)

## 2024-03-14 LAB — COMPREHENSIVE METABOLIC PANEL WITH GFR
ALT: 19 U/L (ref 0–53)
AST: 17 U/L (ref 0–37)
Albumin: 4.9 g/dL (ref 3.5–5.2)
Alkaline Phosphatase: 68 U/L (ref 39–117)
BUN: 16 mg/dL (ref 6–23)
CO2: 30 meq/L (ref 19–32)
Calcium: 9.5 mg/dL (ref 8.4–10.5)
Chloride: 99 meq/L (ref 96–112)
Creatinine, Ser: 1.2 mg/dL (ref 0.40–1.50)
GFR: 70.37 mL/min (ref >60.00)
Glucose, Bld: 89 mg/dL (ref 70–99)
Potassium: 4.5 meq/L (ref 3.5–5.1)
Sodium: 137 meq/L (ref 135–145)
Total Bilirubin: 0.7 mg/dL (ref 0.2–1.2)
Total Protein: 7.4 g/dL (ref 6.0–8.3)

## 2024-03-14 LAB — VITAMIN B12: Vitamin B-12: 261 pg/mL (ref 211–911)

## 2024-03-14 LAB — TSH: TSH: 1.85 u[IU]/mL (ref 0.35–5.50)

## 2024-03-14 LAB — VITAMIN D 25 HYDROXY (VIT D DEFICIENCY, FRACTURES): VITD: 76.71 ng/mL (ref 30.00–100.00)

## 2024-03-14 LAB — PSA: PSA: 0.3 ng/mL (ref 0.10–4.00)

## 2024-04-04 ENCOUNTER — Other Ambulatory Visit: Payer: Self-pay | Admitting: Family Medicine

## 2024-04-26 ENCOUNTER — Other Ambulatory Visit: Payer: Self-pay | Admitting: Family Medicine

## 2024-06-01 ENCOUNTER — Other Ambulatory Visit: Payer: Self-pay | Admitting: Family Medicine

## 2024-06-01 NOTE — Telephone Encounter (Signed)
 Name of Medication: Klonopin  Name of Pharmacy: [facility] Last Fill or Written Date and Quantity: 03/13/24 #10 tabs/ 0 refills  Last Office Visit and Type: CPE 03/13/24 Next Office Visit and Type: none scheduled

## 2024-08-04 ENCOUNTER — Other Ambulatory Visit: Payer: Self-pay | Admitting: Family Medicine

## 2024-08-07 ENCOUNTER — Encounter: Payer: Self-pay | Admitting: Family Medicine

## 2024-09-21 ENCOUNTER — Encounter: Payer: Self-pay | Admitting: Podiatry

## 2024-09-21 ENCOUNTER — Ambulatory Visit: Payer: Self-pay | Admitting: Podiatry

## 2024-09-21 ENCOUNTER — Ambulatory Visit

## 2024-09-21 VITALS — Ht 73.0 in | Wt 235.4 lb

## 2024-09-21 DIAGNOSIS — M7752 Other enthesopathy of left foot: Secondary | ICD-10-CM

## 2024-09-21 DIAGNOSIS — M7751 Other enthesopathy of right foot: Secondary | ICD-10-CM

## 2024-09-21 DIAGNOSIS — M19071 Primary osteoarthritis, right ankle and foot: Secondary | ICD-10-CM

## 2024-09-21 DIAGNOSIS — M19072 Primary osteoarthritis, left ankle and foot: Secondary | ICD-10-CM | POA: Diagnosis not present

## 2024-09-26 ENCOUNTER — Other Ambulatory Visit: Payer: Self-pay | Admitting: Family Medicine

## 2024-09-27 NOTE — Telephone Encounter (Signed)
 Name of Medication: Klonopin  Name of Pharmacy: [facility] Last Fill or Written Date and Quantity: 06/01/24 #10 tabs/ 0 refills  Last Office Visit and Type: CPE 03/13/24 Next Office Visit and Type: none scheduled

## 2024-10-01 ENCOUNTER — Telehealth: Payer: Self-pay | Admitting: Podiatry

## 2024-10-01 NOTE — Telephone Encounter (Signed)
 Called patient to schedule surgery- Left message for him to return my call.

## 2024-10-01 NOTE — Progress Notes (Addendum)
 Chief Complaint  Patient presents with   Ankle Pain    Pt is here due to bilateral ankle pain, states he has had previous ankle injuries in the pass, sports related, states the last 5 years has been the pain has gotten worst, has just been dealing with the pain, very painful to walk on, wants to know his options.    HPI: 53 y.o. male otherwise healthy and active presenting today for evaluation of chronic bilateral ankle pain.  The pain has slowly progressed and increased over the last several years.  History of multiple sports injuries in the past.  No prior history of surgery to the lower extremities.  He states that over the last few years he has began to have significant pain and tenderness to the ankles due to the arthritis that he knows that he has.  He states that his father had ankle arthrodesis surgery with very good results and outcomes and he would like to discuss surgery today  Past Medical History:  Diagnosis Date   Allergy    Anxiety    Asthma    Dysphagia    ED (erectile dysfunction)    GERD (gastroesophageal reflux disease)    History of COVID-19 10/21/2020   Hyperlipidemia    Seasonal allergies     Past Surgical History:  Procedure Laterality Date   DISTAL BICEPS TENDON REPAIR Right 12/03/2020   Procedure: DISTAL BICEPS TENDON REPAIR;  Surgeon: Shari Sieving, MD;  [facility];  Service: Orthopedics;  Laterality: Right;    Allergies  Allergen Reactions   Shrimp [Shellfish Allergy] Hives and Shortness Of Breath   Codeine Nausea Only   Tramadol Other (See Comments)    Does likes like how it makes him feel     Physical Exam: General: The patient is alert and oriented x3 in no acute distress.  Dermatology: Skin is warm, dry and supple bilateral lower extremities.   Vascular: Palpable pedal pulses bilaterally. Capillary refill within normal limits.  No appreciable edema.  No erythema.  Neurological: Grossly intact via light  touch  Musculoskeletal Exam: Tenderness with palpation and range of motion and crepitus also noted to the ankle joints bilateral.  There is also tenderness with inversion and eversion of the subtalar joint  Radiographic Exam B/L ankles 09/21/2024:  Severe advanced degenerative changes noted to the tibiotalar joint with joint space narrowing.  None congruent tibiotalar joint also noted bilateral.  Consistent with advanced severe degenerative arthritis. On lateral view there also appears to be significant amount of degenerative changes noted to the subtalar joint.  CT scan beneficial for additional imaging  Assessment/Plan of Care: 1.  Chronic severe arthritis bilateral ankles 2.  Subtalar joint arthritis bilateral  -Patient evaluated.  X-rays reviewed -Given the severe arthritis to the bilateral ankles we did discuss surgery today.  Unfortunately he has dealt with this ankle pain for several years without any significant improvement.  Today we discussed ankle arthrodesis surgery to fuse the ankle joint and alleviate potentially the patient's symptoms and pain.  Risk benefits advantages and disadvantages of the procedure were explained in length in detail the patient.  No guarantees were expressed or implied.  Postoperative recovery course was also explained as well as reasonable expectations postoperatively.  All questions were answered.  After lengthy discussion he agrees that he would like to have ankle fusion surgery.  Most recently over the last few years the right ankle has been the most symptomatic.  We will proceed first  with a right ankle arthrodesis.  He states that his father had severe arthritis in the ankle and had the same procedure performed with very good results. -Authorization for surgery was initiated today.  Surgery will consist of right ankle arthrodesis.  Depending on the results of the CT scan may need to consider subtalar joint arthrodesis as well -Prior to surgery CT ankle would  be beneficial for preoperative planning -Return to clinic 1 week postop  *pastor    Thresa EMERSON Sar, DPM [facility]  Dr. Thresa EMERSON Sar, DPM    [address] [place], [place] [address]                Office [phone]  Fax [phone]

## 2024-10-08 ENCOUNTER — Encounter: Payer: Self-pay | Admitting: Podiatry

## 2024-10-16 ENCOUNTER — Other Ambulatory Visit: Payer: Self-pay | Admitting: Podiatry

## 2024-10-16 DIAGNOSIS — M19071 Primary osteoarthritis, right ankle and foot: Secondary | ICD-10-CM

## 2024-10-16 DIAGNOSIS — M19072 Primary osteoarthritis, left ankle and foot: Secondary | ICD-10-CM

## 2024-10-17 ENCOUNTER — Encounter: Payer: Self-pay | Admitting: *Deleted

## 2024-10-24 ENCOUNTER — Other Ambulatory Visit: Payer: Self-pay | Admitting: Family Medicine

## 2024-10-26 ENCOUNTER — Ambulatory Visit

## 2024-10-29 ENCOUNTER — Telehealth: Payer: Self-pay

## 2024-10-29 NOTE — Telephone Encounter (Signed)
 CT PA for left ankle is in P2P status. If you would like to do a P2P you have to call [phone] and reference tracking # [id number].

## 2024-10-31 NOTE — Telephone Encounter (Signed)
 CT was denied   There is an appeal available.

## 2024-11-20 ENCOUNTER — Telehealth: Payer: Self-pay | Admitting: Podiatry

## 2024-11-20 NOTE — Telephone Encounter (Signed)
 Patient called in regards to January surgery date. Patient was supposed to be scheduled for 12/13/2024 but never contacted me back after reaching out to confirm date. Patient is now scheduled for 01/03/2025. Patient not on GLP1 or blood thinners. Patient pharmacy correct in chart.

## 2024-11-28 ENCOUNTER — Telehealth: Payer: Self-pay

## 2024-11-28 NOTE — Telephone Encounter (Signed)
 Patient was scheduled for CT after PA expired. I will have to submit for new PA. The right ankle was the only one approved. Do you want me to resubmit for bilateral of just the right since the right was the worse one?

## 2024-12-07 ENCOUNTER — Ambulatory Visit

## 2024-12-10 ENCOUNTER — Encounter: Payer: Self-pay | Admitting: Podiatry

## 2024-12-14 ENCOUNTER — Ambulatory Visit
Admission: RE | Admit: 2024-12-14 | Discharge: 2024-12-14 | Disposition: A | Source: Ambulatory Visit | Attending: Podiatry

## 2024-12-14 DIAGNOSIS — M19071 Primary osteoarthritis, right ankle and foot: Secondary | ICD-10-CM | POA: Diagnosis present

## 2024-12-24 ENCOUNTER — Ambulatory Visit: Admitting: Family Medicine

## 2024-12-24 ENCOUNTER — Encounter: Payer: Self-pay | Admitting: Family Medicine

## 2024-12-24 VITALS — BP 116/78 | HR 70 | Temp 98.0°F | Ht 73.0 in | Wt 236.2 lb

## 2024-12-24 DIAGNOSIS — F4010 Social phobia, unspecified: Secondary | ICD-10-CM | POA: Diagnosis not present

## 2024-12-24 DIAGNOSIS — J4520 Mild intermittent asthma, uncomplicated: Secondary | ICD-10-CM

## 2024-12-24 DIAGNOSIS — Z01810 Encounter for preprocedural cardiovascular examination: Secondary | ICD-10-CM | POA: Insufficient documentation

## 2024-12-24 MED ORDER — SERTRALINE HCL 50 MG PO TABS: ORAL_TABLET | ORAL | 1 refills | Status: AC

## 2024-12-24 NOTE — H&P (View-Only) (Signed)
 "  Subjective:    Patient ID: Mario Lawrence, male    DOB: 06-Mar-1971, 54 y.o.   MRN: [id number]  HPI  Wt Readings from Last 3 Encounters:  12/24/24 236 lb 4 oz (107.2 kg)  09/21/24 235 lb 6.1 oz (106.8 kg)  03/13/24 235 lb 6 oz (106.8 kg)   31.17 kg/m  Vitals:   12/24/24 0800  BP: 116/78  Pulse: 70  Temp: 98 F (36.7 C)  SpO2: 98%    Pt presents for pre surgical appointment  Going to [facility]  Dr Thresa Sar   Surg is 01/11/25   Will have ankle fused right   (spinal block/ ? General)  Past sports injuries  Bone on bone arthritis and   Needs both done-will do the left one later   Will be no weight bearing for 6 weeks - then longer to get out of boot   No tape,iodine or latex allergies  No problems with anesthesia before   No cp or shortness of breath    History of asthma  Only when sick now (when young was more often)   GERD- takes protonix  40 mg prn   Social anx disorder  Takes sertraline  50 mg am and 25 in pm   No exercise intolerance except for the ankles  Goes to the gym 3-4 d per week - doing upper body training   Cholesterol Lab Results  Component Value Date   CHOL 241 (H) 03/13/2024   HDL 49.80 03/13/2024   LDLCALC 159 (H) 03/13/2024   LDLDIRECT 136.0 12/24/2022   TRIG 158.0 (H) 03/13/2024   CHOLHDL 5 03/13/2024   The 10-year ASCVD risk score (Arnett DK, et al., 2019) is: 4.2%   Values used to calculate the score:     Age: 73 years     Clinically relevant sex: Male     Is Non-Hispanic African American: No     Diabetic: No     Tobacco smoker: No     Systolic Blood Pressure: 116 mmHg     Is BP treated: No     HDL Cholesterol: 49.8 mg/dL     Total Cholesterol: 241 mg/dL  Lab Results  Component Value Date   NA 137 03/13/2024   K 4.5 03/13/2024   CO2 30 03/13/2024   GLUCOSE 89 03/13/2024   BUN 16 03/13/2024   CREATININE 1.20 03/13/2024   CALCIUM 9.5 03/13/2024   GFR 70.37 03/13/2024   GFRNONAA 80.26 12/11/2009    Lab Results  Component Value Date   ALT 19 03/13/2024   AST 17 03/13/2024   ALKPHOS 68 03/13/2024   BILITOT 0.7 03/13/2024    Lab Results  Component Value Date   WBC 5.0 03/13/2024   HGB 16.2 03/13/2024   HCT 46.9 03/13/2024   MCV 92.3 03/13/2024   PLT 277.0 03/13/2024     Nsaids -none currently  Asa- none currently  No blood thinners of any kind   EKG today NSR rate of 72     Patient Active Problem List   Diagnosis Date Noted   Pre-operative cardiovascular examination 12/24/2024   Current use of proton pump inhibitor 11/25/2021   Performance anxiety 06/05/2018   Elevated random blood glucose level 03/03/2018   Social anxiety disorder 03/03/2018   Prostate cancer screening 02/13/2017   Colon cancer screening 08/19/2014   Family history of colon cancer 05/15/2013   Routine general medical examination at a health care facility 03/25/2011   Seasonal allergies  Hyperlipidemia 03/13/2010   ED (erectile dysfunction) 12/11/2009   Asthma 12/11/2009   GERD 12/11/2009   Past Medical History:  Diagnosis Date   Allergy    Anxiety    Asthma    Dysphagia    ED (erectile dysfunction)    GERD (gastroesophageal reflux disease)    History of COVID-19 10/21/2020   Hyperlipidemia    Seasonal allergies    Past Surgical History:  Procedure Laterality Date   DISTAL BICEPS TENDON REPAIR Right 12/03/2020   Procedure: DISTAL BICEPS TENDON REPAIR;  Surgeon: Shari Sieving, MD;  Location: [facility];  Service: Orthopedics;  Laterality: Right;   Social History[1] Family History  Problem Relation Age of Onset   Colon cancer Father        in his 29s   Ovarian cancer Maternal Grandmother    Lung cancer Maternal Grandfather    COPD Maternal Grandfather    Ovarian cancer Paternal Grandmother    Esophageal cancer Neg Hx    Stomach cancer Neg Hx    Rectal cancer Neg Hx    Allergies[2] Medications Ordered Prior to Encounter[3]  Review of Systems   Constitutional:  Negative for activity change, appetite change, fatigue, fever and unexpected weight change.  HENT:  Negative for congestion, rhinorrhea, sore throat and trouble swallowing.   Eyes:  Negative for pain, redness, itching and visual disturbance.  Respiratory:  Negative for cough, chest tightness, shortness of breath and wheezing.   Cardiovascular:  Negative for chest pain and palpitations.  Gastrointestinal:  Negative for abdominal pain, blood in stool, constipation, diarrhea and nausea.  Endocrine: Negative for cold intolerance, heat intolerance, polydipsia and polyuria.  Genitourinary:  Negative for difficulty urinating, dysuria, frequency and urgency.  Musculoskeletal:  Positive for arthralgias. Negative for joint swelling and myalgias.  Skin:  Negative for pallor and rash.  Neurological:  Negative for dizziness, tremors, weakness, numbness and headaches.  Hematological:  Negative for adenopathy. Does not bruise/bleed easily.  Psychiatric/Behavioral:  Negative for decreased concentration and dysphoric mood. The patient is not nervous/anxious.        Social anxiety        Objective:   Physical Exam Constitutional:      General: He is not in acute distress.    Appearance: Normal appearance. He is well-developed. He is not ill-appearing or diaphoretic.     Comments: Muscular large frame  HENT:     Head: Normocephalic and atraumatic.     Nose: Nose normal.     Mouth/Throat:     Mouth: Mucous membranes are moist.     Pharynx: Oropharynx is clear.  Eyes:     Conjunctiva/sclera: Conjunctivae normal.     Pupils: Pupils are equal, round, and reactive to light.  Neck:     Thyroid : No thyromegaly.     Vascular: No carotid bruit or JVD.  Cardiovascular:     Rate and Rhythm: Normal rate and regular rhythm.     Pulses: Normal pulses.     Heart sounds: Normal heart sounds.     No gallop.  Pulmonary:     Effort: Pulmonary effort is normal. No respiratory distress.      Breath sounds: Normal breath sounds. No wheezing or rales.  Abdominal:     General: There is no distension or abdominal bruit.     Palpations: Abdomen is soft. There is no mass.     Tenderness: There is no abdominal tenderness. There is no guarding or rebound.  Musculoskeletal:  Cervical back: Normal range of motion and neck supple.     Right lower leg: No edema.     Left lower leg: No edema.     Comments: Bilateral ankle deformity worse on R  Lymphadenopathy:     Cervical: No cervical adenopathy.  Skin:    General: Skin is warm and dry.     Coloration: Skin is not pale.     Findings: No rash.  Neurological:     Mental Status: He is alert.     Cranial Nerves: No cranial nerve deficit.     Motor: No weakness.     Coordination: Coordination normal.     Deep Tendon Reflexes: Reflexes are normal and symmetric. Reflexes normal.  Psychiatric:        Mood and Affect: Mood normal.           Assessment & Plan:   Problem List Items Addressed This Visit       Respiratory   Asthma (Chronic)   Mild intermittent/only symptomatic when sick with uri  Has albuterol  for prn use Normal exam         Other   Social anxiety disorder   Continues sertraline  50 in am and 25 mg in pm Works well  Mood is good      Relevant Medications   sertraline  (ZOLOFT ) 50 MG tablet   Pre-operative cardiovascular examination - Primary   54 yo with past history of asthma (now only when she is sick)  Has ankle fusion planned with Dr Janit on 01/11/25   No CV problems  Very good exercise tolerance  No nsaids  Reviewed allergies  Normal vitals and exam   Low risk for surgery      Relevant Orders   EKG 12-Lead      [1]  Social History Tobacco Use   Smoking status: Former    Current packs/day: 0.00    Types: Cigarettes    Quit date: 12/06/2008    Years since quitting: 16.0   Smokeless tobacco: Never  Vaping Use   Vaping status: Never Used  Substance Use Topics   Alcohol use: Yes     Alcohol/week: 0.0 standard drinks of alcohol    Comment: Occasional-weekends   Drug use: No  [2]  Allergies Allergen Reactions   Shrimp [Shellfish Allergy] Hives and Shortness Of Breath   Codeine Nausea Only   Tramadol Other (See Comments)    Does likes like how it makes him feel  [3]  Current Outpatient Medications on File Prior to Visit  Medication Sig Dispense Refill   albuterol  (VENTOLIN  HFA) 108 (90 Base) MCG/ACT inhaler Inhale 2 puffs into the lungs every 4 (four) hours as needed. 1 each 3   clonazePAM  (KLONOPIN ) 0.5 MG tablet TAKE A HALF TABLET BY MOUTH AS NEEDED FOR ANXIETY. 10 tablet 0   diphenhydrAMINE  (BENADRYL ) 25 MG tablet Take 2 tablets (50 mg total) by mouth every 4 (four) hours as needed for itching or allergies. 20 tablet 0   EPINEPHrine  (EPIPEN ) 0.3 mg/0.3 mL IJ SOAJ injection Inject 0.3 mLs (0.3 mg total) into the muscle as needed. 1 Device 2   pantoprazole  (PROTONIX ) 40 MG tablet TAKE ONE TABLET BY MOUTH 1-2 TIMES DAILY AS NEEDED 60 tablet 1   tadalafil  (CIALIS ) 20 MG tablet TAKE ONE TABLET BY MOUTH DAILY AS NEEDED FOR ERECTILE DYSFUNCTION 10 tablet 11   No current facility-administered medications on file prior to visit.   "

## 2024-12-24 NOTE — Assessment & Plan Note (Signed)
 Continues sertraline  50 in am and 25 mg in pm Works well  Mood is good

## 2024-12-24 NOTE — Assessment & Plan Note (Signed)
 Mild intermittent/only symptomatic when sick with uri  Has albuterol  for prn use Normal exam

## 2024-12-24 NOTE — Assessment & Plan Note (Signed)
 54 yo with past history of asthma (now only when she is sick)  Has ankle fusion planned with Dr Janit on 01/11/25   No CV problems  Very good exercise tolerance  No nsaids  Reviewed allergies  Normal vitals and exam   Low risk for surgery

## 2024-12-24 NOTE — Progress Notes (Signed)
 "  Subjective:    Patient ID: Mario Lawrence, male    DOB: 06-Mar-1971, 54 y.o.   MRN: [id number]  HPI  Wt Readings from Last 3 Encounters:  12/24/24 236 lb 4 oz (107.2 kg)  09/21/24 235 lb 6.1 oz (106.8 kg)  03/13/24 235 lb 6 oz (106.8 kg)   31.17 kg/m  Vitals:   12/24/24 0800  BP: 116/78  Pulse: 70  Temp: 98 F (36.7 C)  SpO2: 98%    Pt presents for pre surgical appointment  Going to [facility]  Dr Thresa Sar   Surg is 01/11/25   Will have ankle fused right   (spinal block/ ? General)  Past sports injuries  Bone on bone arthritis and   Needs both done-will do the left one later   Will be no weight bearing for 6 weeks - then longer to get out of boot   No tape,iodine or latex allergies  No problems with anesthesia before   No cp or shortness of breath    History of asthma  Only when sick now (when young was more often)   GERD- takes protonix  40 mg prn   Social anx disorder  Takes sertraline  50 mg am and 25 in pm   No exercise intolerance except for the ankles  Goes to the gym 3-4 d per week - doing upper body training   Cholesterol Lab Results  Component Value Date   CHOL 241 (H) 03/13/2024   HDL 49.80 03/13/2024   LDLCALC 159 (H) 03/13/2024   LDLDIRECT 136.0 12/24/2022   TRIG 158.0 (H) 03/13/2024   CHOLHDL 5 03/13/2024   The 10-year ASCVD risk score (Arnett DK, et al., 2019) is: 4.2%   Values used to calculate the score:     Age: 73 years     Clinically relevant sex: Male     Is Non-Hispanic African American: No     Diabetic: No     Tobacco smoker: No     Systolic Blood Pressure: 116 mmHg     Is BP treated: No     HDL Cholesterol: 49.8 mg/dL     Total Cholesterol: 241 mg/dL  Lab Results  Component Value Date   NA 137 03/13/2024   K 4.5 03/13/2024   CO2 30 03/13/2024   GLUCOSE 89 03/13/2024   BUN 16 03/13/2024   CREATININE 1.20 03/13/2024   CALCIUM 9.5 03/13/2024   GFR 70.37 03/13/2024   GFRNONAA 80.26 12/11/2009    Lab Results  Component Value Date   ALT 19 03/13/2024   AST 17 03/13/2024   ALKPHOS 68 03/13/2024   BILITOT 0.7 03/13/2024    Lab Results  Component Value Date   WBC 5.0 03/13/2024   HGB 16.2 03/13/2024   HCT 46.9 03/13/2024   MCV 92.3 03/13/2024   PLT 277.0 03/13/2024     Nsaids -none currently  Asa- none currently  No blood thinners of any kind   EKG today NSR rate of 72     Patient Active Problem List   Diagnosis Date Noted   Pre-operative cardiovascular examination 12/24/2024   Current use of proton pump inhibitor 11/25/2021   Performance anxiety 06/05/2018   Elevated random blood glucose level 03/03/2018   Social anxiety disorder 03/03/2018   Prostate cancer screening 02/13/2017   Colon cancer screening 08/19/2014   Family history of colon cancer 05/15/2013   Routine general medical examination at a health care facility 03/25/2011   Seasonal allergies  Hyperlipidemia 03/13/2010   ED (erectile dysfunction) 12/11/2009   Asthma 12/11/2009   GERD 12/11/2009   Past Medical History:  Diagnosis Date   Allergy    Anxiety    Asthma    Dysphagia    ED (erectile dysfunction)    GERD (gastroesophageal reflux disease)    History of COVID-19 10/21/2020   Hyperlipidemia    Seasonal allergies    Past Surgical History:  Procedure Laterality Date   DISTAL BICEPS TENDON REPAIR Right 12/03/2020   Procedure: DISTAL BICEPS TENDON REPAIR;  Surgeon: Shari Sieving, MD;  Location: [facility];  Service: Orthopedics;  Laterality: Right;   Social History[1] Family History  Problem Relation Age of Onset   Colon cancer Father        in his 29s   Ovarian cancer Maternal Grandmother    Lung cancer Maternal Grandfather    COPD Maternal Grandfather    Ovarian cancer Paternal Grandmother    Esophageal cancer Neg Hx    Stomach cancer Neg Hx    Rectal cancer Neg Hx    Allergies[2] Medications Ordered Prior to Encounter[3]  Review of Systems   Constitutional:  Negative for activity change, appetite change, fatigue, fever and unexpected weight change.  HENT:  Negative for congestion, rhinorrhea, sore throat and trouble swallowing.   Eyes:  Negative for pain, redness, itching and visual disturbance.  Respiratory:  Negative for cough, chest tightness, shortness of breath and wheezing.   Cardiovascular:  Negative for chest pain and palpitations.  Gastrointestinal:  Negative for abdominal pain, blood in stool, constipation, diarrhea and nausea.  Endocrine: Negative for cold intolerance, heat intolerance, polydipsia and polyuria.  Genitourinary:  Negative for difficulty urinating, dysuria, frequency and urgency.  Musculoskeletal:  Positive for arthralgias. Negative for joint swelling and myalgias.  Skin:  Negative for pallor and rash.  Neurological:  Negative for dizziness, tremors, weakness, numbness and headaches.  Hematological:  Negative for adenopathy. Does not bruise/bleed easily.  Psychiatric/Behavioral:  Negative for decreased concentration and dysphoric mood. The patient is not nervous/anxious.        Social anxiety        Objective:   Physical Exam Constitutional:      General: He is not in acute distress.    Appearance: Normal appearance. He is well-developed. He is not ill-appearing or diaphoretic.     Comments: Muscular large frame  HENT:     Head: Normocephalic and atraumatic.     Nose: Nose normal.     Mouth/Throat:     Mouth: Mucous membranes are moist.     Pharynx: Oropharynx is clear.  Eyes:     Conjunctiva/sclera: Conjunctivae normal.     Pupils: Pupils are equal, round, and reactive to light.  Neck:     Thyroid : No thyromegaly.     Vascular: No carotid bruit or JVD.  Cardiovascular:     Rate and Rhythm: Normal rate and regular rhythm.     Pulses: Normal pulses.     Heart sounds: Normal heart sounds.     No gallop.  Pulmonary:     Effort: Pulmonary effort is normal. No respiratory distress.      Breath sounds: Normal breath sounds. No wheezing or rales.  Abdominal:     General: There is no distension or abdominal bruit.     Palpations: Abdomen is soft. There is no mass.     Tenderness: There is no abdominal tenderness. There is no guarding or rebound.  Musculoskeletal:  Cervical back: Normal range of motion and neck supple.     Right lower leg: No edema.     Left lower leg: No edema.     Comments: Bilateral ankle deformity worse on R  Lymphadenopathy:     Cervical: No cervical adenopathy.  Skin:    General: Skin is warm and dry.     Coloration: Skin is not pale.     Findings: No rash.  Neurological:     Mental Status: He is alert.     Cranial Nerves: No cranial nerve deficit.     Motor: No weakness.     Coordination: Coordination normal.     Deep Tendon Reflexes: Reflexes are normal and symmetric. Reflexes normal.  Psychiatric:        Mood and Affect: Mood normal.           Assessment & Plan:   Problem List Items Addressed This Visit       Respiratory   Asthma (Chronic)   Mild intermittent/only symptomatic when sick with uri  Has albuterol  for prn use Normal exam         Other   Social anxiety disorder   Continues sertraline  50 in am and 25 mg in pm Works well  Mood is good      Relevant Medications   sertraline  (ZOLOFT ) 50 MG tablet   Pre-operative cardiovascular examination - Primary   54 yo with past history of asthma (now only when she is sick)  Has ankle fusion planned with Dr Janit on 01/11/25   No CV problems  Very good exercise tolerance  No nsaids  Reviewed allergies  Normal vitals and exam   Low risk for surgery      Relevant Orders   EKG 12-Lead      [1]  Social History Tobacco Use   Smoking status: Former    Current packs/day: 0.00    Types: Cigarettes    Quit date: 12/06/2008    Years since quitting: 16.0   Smokeless tobacco: Never  Vaping Use   Vaping status: Never Used  Substance Use Topics   Alcohol use: Yes     Alcohol/week: 0.0 standard drinks of alcohol    Comment: Occasional-weekends   Drug use: No  [2]  Allergies Allergen Reactions   Shrimp [Shellfish Allergy] Hives and Shortness Of Breath   Codeine Nausea Only   Tramadol Other (See Comments)    Does likes like how it makes him feel  [3]  Current Outpatient Medications on File Prior to Visit  Medication Sig Dispense Refill   albuterol  (VENTOLIN  HFA) 108 (90 Base) MCG/ACT inhaler Inhale 2 puffs into the lungs every 4 (four) hours as needed. 1 each 3   clonazePAM  (KLONOPIN ) 0.5 MG tablet TAKE A HALF TABLET BY MOUTH AS NEEDED FOR ANXIETY. 10 tablet 0   diphenhydrAMINE  (BENADRYL ) 25 MG tablet Take 2 tablets (50 mg total) by mouth every 4 (four) hours as needed for itching or allergies. 20 tablet 0   EPINEPHrine  (EPIPEN ) 0.3 mg/0.3 mL IJ SOAJ injection Inject 0.3 mLs (0.3 mg total) into the muscle as needed. 1 Device 2   pantoprazole  (PROTONIX ) 40 MG tablet TAKE ONE TABLET BY MOUTH 1-2 TIMES DAILY AS NEEDED 60 tablet 1   tadalafil  (CIALIS ) 20 MG tablet TAKE ONE TABLET BY MOUTH DAILY AS NEEDED FOR ERECTILE DYSFUNCTION 10 tablet 11   No current facility-administered medications on file prior to visit.   "

## 2024-12-24 NOTE — Patient Instructions (Addendum)
 Schedule your annual exam after April 8    Take care of yourself   I will sent in paperwork

## 2024-12-25 ENCOUNTER — Telehealth: Payer: Self-pay | Admitting: Podiatry

## 2024-12-25 NOTE — Telephone Encounter (Signed)
 DOS- 01/11/2025  ARTHRODESIS, ANKLE, OPEN RT- [id number]  [facility] NEXT EFFECTIVE DATE- 12/07/2023  DEDUCTIBLE- $7500 REMAINING- $7500 OOP- $10000 REMAINING- $10000 FAMILY DEDUCTIBLE- $15000 REMAINING- $15000 FAMILY OOP- $20000 REMAINING- $80049 COINSURANCE- 0%  PER [employer] [employer], PRIOR AUTH FOR CPT CODE 72129 HAS BEEN APPROVED FROM 01/11/2025-04/10/2025. AUTH# [id number]

## 2025-01-04 ENCOUNTER — Other Ambulatory Visit: Payer: Self-pay

## 2025-01-04 ENCOUNTER — Encounter
Admission: RE | Admit: 2025-01-04 | Discharge: 2025-01-04 | Disposition: A | Source: Ambulatory Visit | Attending: Podiatry

## 2025-01-11 ENCOUNTER — Encounter: Payer: Self-pay | Admitting: Podiatry

## 2025-01-11 ENCOUNTER — Encounter: Admission: RE | Disposition: A | Payer: Self-pay | Source: Home / Self Care | Attending: Podiatry

## 2025-01-11 ENCOUNTER — Ambulatory Visit
Admission: RE | Admit: 2025-01-11 | Discharge: 2025-01-11 | Disposition: A | Source: Home / Self Care | Attending: Podiatry | Admitting: Podiatry

## 2025-01-11 ENCOUNTER — Other Ambulatory Visit: Payer: Self-pay

## 2025-01-11 ENCOUNTER — Ambulatory Visit

## 2025-01-11 ENCOUNTER — Ambulatory Visit: Payer: Self-pay | Admitting: Urgent Care

## 2025-01-11 ENCOUNTER — Ambulatory Visit: Admitting: Anesthesiology

## 2025-01-11 ENCOUNTER — Encounter: Admitting: Podiatry

## 2025-01-11 MED ORDER — EPHEDRINE 5 MG/ML INJ
INTRAVENOUS | Status: AC
  Filled 2025-01-11: qty 5

## 2025-01-11 MED ORDER — BUPIVACAINE HCL (PF) 0.5 % IJ SOLN
INTRAMUSCULAR | Status: AC
  Filled 2025-01-11: qty 10

## 2025-01-11 MED ORDER — BUPIVACAINE HCL (PF) 0.5 % IJ SOLN
INTRAMUSCULAR | Status: AC
  Filled 2025-01-11: qty 40

## 2025-01-11 MED ORDER — MIDAZOLAM HCL 2 MG/2ML IJ SOLN
INTRAMUSCULAR | Status: AC
  Filled 2025-01-11: qty 2

## 2025-01-11 MED ORDER — DROPERIDOL 2.5 MG/ML IJ SOLN: 0.6250 mg | Freq: Once | INTRAMUSCULAR | Status: DC | PRN

## 2025-01-11 MED ORDER — BUPIVACAINE LIPOSOME 1.3 % IJ SUSP
INTRAMUSCULAR | Status: AC
  Filled 2025-01-11: qty 20

## 2025-01-11 MED ORDER — CEFAZOLIN SODIUM-DEXTROSE 2-4 GM/100ML-% IV SOLN
2.0000 g | INTRAVENOUS | Status: AC
  Administered 2025-01-11: 2 g via INTRAVENOUS

## 2025-01-11 MED ORDER — 0.9 % SODIUM CHLORIDE (POUR BTL) OPTIME
TOPICAL | Status: DC | PRN
  Administered 2025-01-11: 500 mL

## 2025-01-11 MED ORDER — LIDOCAINE HCL (PF) 1 % IJ SOLN
INTRAMUSCULAR | Status: DC | PRN
  Administered 2025-01-11: 10 mL

## 2025-01-11 MED ORDER — CHLORHEXIDINE GLUCONATE 0.12 % MT SOLN
15.0000 mL | Freq: Once | OROMUCOSAL | Status: AC
  Administered 2025-01-11: 15 mL via OROMUCOSAL

## 2025-01-11 MED ORDER — OXYCODONE-ACETAMINOPHEN 10-325 MG PO TABS: 1.0000 | ORAL_TABLET | Freq: Four times a day (QID) | ORAL | 0 refills | Status: AC | PRN

## 2025-01-11 MED ORDER — PROPOFOL 1000 MG/100ML IV EMUL
INTRAVENOUS | Status: AC
  Filled 2025-01-11: qty 100

## 2025-01-11 MED ORDER — LIDOCAINE HCL (PF) 1 % IJ SOLN
INTRAMUSCULAR | Status: AC
  Filled 2025-01-11: qty 30

## 2025-01-11 MED ORDER — PHENYLEPHRINE HCL-NACL 20-0.9 MG/250ML-% IV SOLN
INTRAVENOUS | Status: AC
  Filled 2025-01-11: qty 250

## 2025-01-11 MED ORDER — ACETAMINOPHEN 10 MG/ML IV SOLN
INTRAVENOUS | Status: DC | PRN
  Administered 2025-01-11: 1000 mg via INTRAVENOUS

## 2025-01-11 MED ORDER — EPHEDRINE SULFATE-NACL 50-0.9 MG/10ML-% IV SOSY
PREFILLED_SYRINGE | INTRAVENOUS | Status: DC | PRN
  Administered 2025-01-11: 5 mg via INTRAVENOUS
  Administered 2025-01-11: 10 mg via INTRAVENOUS
  Administered 2025-01-11: 5 mg via INTRAVENOUS

## 2025-01-11 MED ORDER — PHENYLEPHRINE HCL-NACL 20-0.9 MG/250ML-% IV SOLN
INTRAVENOUS | Status: DC | PRN
  Administered 2025-01-11: 40 ug/min via INTRAVENOUS

## 2025-01-11 MED ORDER — ORAL CARE MOUTH RINSE: 15.0000 mL | Freq: Once | OROMUCOSAL | Status: AC

## 2025-01-11 MED ORDER — CHLORHEXIDINE GLUCONATE 0.12 % MT SOLN
OROMUCOSAL | Status: AC
  Filled 2025-01-11: qty 15

## 2025-01-11 MED ORDER — PHENYLEPHRINE 80 MCG/ML (10ML) SYRINGE FOR IV PUSH (FOR BLOOD PRESSURE SUPPORT)
PREFILLED_SYRINGE | INTRAVENOUS | Status: DC | PRN
  Administered 2025-01-11 (×3): 80 ug via INTRAVENOUS

## 2025-01-11 MED ORDER — PROPOFOL 10 MG/ML IV BOLUS
INTRAVENOUS | Status: AC
  Filled 2025-01-11: qty 20

## 2025-01-11 MED ORDER — ONDANSETRON HCL 4 MG/2ML IJ SOLN
INTRAMUSCULAR | Status: AC
  Filled 2025-01-11: qty 2

## 2025-01-11 MED ORDER — DEXAMETHASONE SOD PHOSPHATE PF 10 MG/ML IJ SOLN
INTRAMUSCULAR | Status: DC | PRN
  Administered 2025-01-11: 10 mg via INTRAVENOUS

## 2025-01-11 MED ORDER — OXYCODONE HCL 5 MG/5ML PO SOLN: 5.0000 mg | Freq: Once | ORAL | Status: DC | PRN

## 2025-01-11 MED ORDER — MIDAZOLAM HCL (PF) 2 MG/2ML IJ SOLN
INTRAMUSCULAR | Status: DC | PRN
  Administered 2025-01-11 (×2): 2 mg via INTRAVENOUS

## 2025-01-11 MED ORDER — FENTANYL CITRATE (PF) 100 MCG/2ML IJ SOLN: 25.0000 ug | INTRAMUSCULAR | Status: DC | PRN

## 2025-01-11 MED ORDER — IBUPROFEN 800 MG PO TABS: 800.0000 mg | ORAL_TABLET | Freq: Three times a day (TID) | ORAL | 1 refills | Status: AC

## 2025-01-11 MED ORDER — OXYCODONE HCL 5 MG PO TABS: 5.0000 mg | ORAL_TABLET | Freq: Once | ORAL | Status: DC | PRN

## 2025-01-11 MED ORDER — BUPIVACAINE HCL (PF) 0.5 % IJ SOLN
INTRAMUSCULAR | Status: DC | PRN
  Administered 2025-01-11 (×2): 20 mL via PERINEURAL

## 2025-01-11 MED ORDER — FENTANYL CITRATE (PF) 100 MCG/2ML IJ SOLN
INTRAMUSCULAR | Status: AC
  Filled 2025-01-11: qty 2

## 2025-01-11 MED ORDER — ACETAMINOPHEN 10 MG/ML IV SOLN: 1000.0000 mg | Freq: Once | INTRAVENOUS | Status: DC | PRN

## 2025-01-11 MED ORDER — BUPIVACAINE HCL (PF) 0.5 % IJ SOLN
INTRAMUSCULAR | Status: AC
  Filled 2025-01-11: qty 30

## 2025-01-11 MED ORDER — DEXAMETHASONE SOD PHOSPHATE PF 10 MG/ML IJ SOLN
INTRAMUSCULAR | Status: AC
  Filled 2025-01-11: qty 1

## 2025-01-11 MED ORDER — LACTATED RINGERS IV SOLN: INTRAVENOUS | Status: DC

## 2025-01-11 MED ORDER — DEXMEDETOMIDINE HCL IN NACL 80 MCG/20ML IV SOLN
INTRAVENOUS | Status: DC | PRN
  Administered 2025-01-11: 8 ug via INTRAVENOUS

## 2025-01-11 MED ORDER — ONDANSETRON HCL 4 MG/2ML IJ SOLN
INTRAMUSCULAR | Status: DC | PRN
  Administered 2025-01-11: 4 mg via INTRAVENOUS

## 2025-01-11 MED ORDER — FENTANYL CITRATE (PF) 100 MCG/2ML IJ SOLN
INTRAMUSCULAR | Status: DC | PRN
  Administered 2025-01-11: 25 ug via INTRAVENOUS
  Administered 2025-01-11: 75 ug via INTRAVENOUS
  Administered 2025-01-11: 50 ug via INTRAVENOUS

## 2025-01-11 MED ORDER — BUPIVACAINE LIPOSOME 1.3 % IJ SUSP
INTRAMUSCULAR | Status: DC | PRN
  Administered 2025-01-11: 20 mL via PERINEURAL

## 2025-01-11 MED ORDER — CEFAZOLIN SODIUM-DEXTROSE 2-4 GM/100ML-% IV SOLN
INTRAVENOUS | Status: AC
  Filled 2025-01-11: qty 100

## 2025-01-11 MED ORDER — LIDOCAINE HCL (CARDIAC) PF 100 MG/5ML IV SOSY
PREFILLED_SYRINGE | INTRAVENOUS | Status: DC | PRN
  Administered 2025-01-11: 40 mg via INTRAVENOUS

## 2025-01-11 MED ORDER — PROPOFOL 500 MG/50ML IV EMUL
INTRAVENOUS | Status: DC | PRN
  Administered 2025-01-11: 150 ug/kg/min via INTRAVENOUS

## 2025-01-11 NOTE — Anesthesia Procedure Notes (Signed)
 Procedure Name: LMA Insertion Date/Time: 01/11/2025 3:02 PM  Performed by: Jackye Spanner, CRNAPre-anesthesia Checklist: Patient identified, Patient being monitored, Timeout performed, Emergency Drugs available and Suction available Patient Re-evaluated:Patient Re-evaluated prior to induction Oxygen Delivery Method: Circle system utilized Preoxygenation: Pre-oxygenation with 100% oxygen Induction Type: IV induction Ventilation: Mask ventilation without difficulty LMA: LMA inserted LMA Size: 4.0 Tube type: Oral Number of attempts: 1 Placement Confirmation: positive ETCO2 and breath sounds checked- equal and bilateral Tube secured with: Tape Dental Injury: Teeth and Oropharynx as per pre-operative assessment  Comments: Smooth atraumatic Igel LMA placement, no complications noted.

## 2025-01-11 NOTE — Anesthesia Procedure Notes (Signed)
 Anesthesia Regional Block: Popliteal block   Pre-Anesthetic Checklist: , timeout performed,  Correct Patient, Correct Site, Correct Laterality,  Correct Procedure, Correct Position, site marked,  Risks and benefits discussed,  Surgical consent,  Pre-op evaluation,  At surgeon's request and post-op pain management  Laterality: Right  Prep: chloraprep       Needles:  Injection technique: Single-shot  Needle Type: Stimiplex     Needle Length: 9cm  Needle Gauge: 22     Additional Needles:   Procedures:,,,, ultrasound used (permanent image in chart),,    Narrative:  Start time: 01/11/2025 1:42 PM End time: 01/11/2025 1:45 PM Injection made incrementally with aspirations every 5 mL.  Performed by: Personally  Anesthesiologist: Vicci Camellia Glatter, MD  Additional Notes: Patient consented for risk and benefits of nerve block including but not limited to nerve damage, failed block, bleeding and infection.  Patient voiced understanding.  Functioning IV was confirmed and monitors were applied.  Timeout done prior to procedure and prior to any sedation being given to the patient.  Patient confirmed procedure site prior to any sedation given to the patient. Sterile prep,hand hygiene and sterile gloves were used.  Minimal sedation used for procedure.  No paresthesia endorsed by patient during the procedure.  Negative aspiration and negative test dose prior to incremental administration of local anesthetic. The patient tolerated the procedure well with no immediate complications.

## 2025-01-11 NOTE — Anesthesia Procedure Notes (Signed)
 Anesthesia Regional Block: Adductor canal block   Pre-Anesthetic Checklist: , timeout performed,  Correct Patient, Correct Site, Correct Laterality,  Correct Procedure, Correct Position, site marked,  Risks and benefits discussed,  Surgical consent,  Pre-op evaluation,  At surgeon's request and post-op pain management  Laterality: Right  Prep: chloraprep       Needles:  Injection technique: Single-shot  Needle Type: Stimiplex     Needle Length: 9cm  Needle Gauge: 22     Additional Needles:   Procedures:,,,, ultrasound used (permanent image in chart),,    Narrative:  Start time: 01/11/2025 1:39 PM End time: 01/11/2025 1:41 PM Injection made incrementally with aspirations every 5 mL.  Performed by: Personally  Anesthesiologist: Vicci Camellia Glatter, MD  Additional Notes: Patient consented for risk and benefits of nerve block including but not limited to nerve damage, failed block, bleeding and infection.  Patient voiced understanding.  Functioning IV was confirmed and monitors were applied.  Timeout done prior to procedure and prior to any sedation being given to the patient.  Patient confirmed procedure site prior to any sedation given to the patient. Sterile prep,hand hygiene and sterile gloves were used.  Minimal sedation used for procedure.  No paresthesia endorsed by patient during the procedure.  Negative aspiration and negative test dose prior to incremental administration of local anesthetic. The patient tolerated the procedure well with no immediate complications.

## 2025-01-11 NOTE — Discharge Instructions (Signed)
 Strict nonweight bearing to the surgical extremity. Leave dressings clean, dry, and intact. Elevate lower extremity. Ice behind knee 55min/hour for the first 24-48 hours.

## 2025-01-11 NOTE — Interval H&P Note (Signed)
 History and Physical Interval Note:  01/11/2025 12:33 PM  Mario Lawrence  has presented today for surgery, with the diagnosis of Arthritis of right ankle [id number].  The various methods of treatment have been discussed with the patient and family. After consideration of risks, benefits and other options for treatment, the patient has consented to  Procedures: ARTHRODESIS ANKLE (Right) as a surgical intervention.  The patient's history has been reviewed, patient examined, no change in status, stable for surgery.  I have reviewed the patient's chart and labs.  Questions were answered to the patient's satisfaction.     [name]

## 2025-01-11 NOTE — Transfer of Care (Signed)
 Immediate Anesthesia Transfer of Care Note  Patient: Mario Lawrence  Procedure(s) Performed: ARTHRODESIS ANKLE (Right: Ankle)  Patient Location: PACU  Anesthesia Type:General  Level of Consciousness: awake, drowsy, and patient cooperative  Airway & Oxygen Therapy: Patient Spontanous Breathing and Patient connected to face mask oxygen  Post-op Assessment: Report given to RN and Post -op Vital signs reviewed and stable  Post vital signs: Reviewed and stable  Last Vitals:  Vitals Value Taken Time  BP 107/67 01/11/25 17:39  Temp    Pulse 76 01/11/25 17:42  Resp 11 01/11/25 17:42  SpO2 99 % 01/11/25 17:42  Vitals shown include unfiled device data.  Last Pain:  Vitals:   01/11/25 1224  TempSrc: Temporal  PainSc: 0-No pain         Complications: No notable events documented.

## 2025-01-11 NOTE — Brief Op Note (Signed)
 OR Date: 01/11/2025 OR Patient Start Time:  1:31 PM  5:35 PM  PATIENT:  Mario Lawrence  54 y.o. male  PRE-OPERATIVE DIAGNOSIS:  Arthritis of right ankle [id number]  POST-OPERATIVE DIAGNOSIS:  Arthritis of right ankle [id number]  PROCEDURE:  Procedures: ARTHRODESIS ANKLE (Right)  SURGEON:  Surgeons and Role:    DEWAINE Janit Thresa CHRISTELLA, DPM - Primary  PHYSICIAN ASSISTANT: None  ASSISTANTS: none   ANESTHESIA:   general  EBL:  20 mL   BLOOD ADMINISTERED:none  DRAINS: none   LOCAL MEDICATIONS USED:  NONE  SPECIMEN:  No Specimen  DISPOSITION OF SPECIMEN:  N/A  COUNTS:  YES  TOURNIQUET:   Total Tourniquet Time Documented: Thigh (Right) - 122 minutes Total: Thigh (Right) - 122 minutes   DICTATION: .Nechama Dictation  PLAN OF CARE: Discharge to home after PACU  PATIENT DISPOSITION:  PACU - hemodynamically stable.   Delay start of Pharmacological VTE agent (>24hrs) due to surgical blood loss or risk of bleeding: not applicable  Thresa EMERSON Janit, DPM [facility]  Dr. Thresa EMERSON Janit, DPM    [address] [place], [place] [address]                Office [phone]  Fax [phone]

## 2025-01-11 NOTE — Anesthesia Preprocedure Evaluation (Addendum)
"                                    Anesthesia Evaluation  Patient identified by MRN, date of birth, ID band Patient awake    Reviewed: Allergy & Precautions, H&P , NPO status , Patient's Chart, lab work & pertinent test results  Airway Mallampati: II  TM Distance: >3 FB Neck ROM: full    Dental no notable dental hx.    Pulmonary asthma , former smoker   Pulmonary exam normal        Cardiovascular negative cardio ROS Normal cardiovascular exam     Neuro/Psych  PSYCHIATRIC DISORDERS Anxiety     negative neurological ROS     GI/Hepatic Neg liver ROS,GERD  ,,  Endo/Other  negative endocrine ROS    Renal/GU negative Renal ROS  negative genitourinary   Musculoskeletal  (+) Arthritis ,    Abdominal  (+) + obese  Peds  Hematology negative hematology ROS (+)   Anesthesia Other Findings Past Medical History: No date: Allergy No date: Anxiety 2025: Arthritis     Comment:  Chronic severe arthritis bilateral ankles, Subtalar               joint arthritis bilateral No date: Asthma No date: Dysphagia No date: ED (erectile dysfunction) No date: GERD (gastroesophageal reflux disease) 10/21/2020: History of COVID-19 No date: Hyperlipidemia No date: Seasonal allergies  Past Surgical History: 2024: COLONOSCOPY 12/03/2020: DISTAL BICEPS TENDON REPAIR; Right     Comment:  Procedure: DISTAL BICEPS TENDON REPAIR;  Surgeon:               Shari Sieving, MD;  Location: [facility];  Service: Orthopedics;  Laterality: Right;     Reproductive/Obstetrics negative OB ROS                              Anesthesia Physical Anesthesia Plan  ASA: 2  Anesthesia Plan: General   Post-op Pain Management: Regional block*   Induction: Intravenous  PONV Risk Score and Plan: Propofol  infusion and TIVA  Airway Management Planned: Natural Airway  Additional Equipment:   Intra-op Plan:   Post-operative Plan:    Informed Consent: I have reviewed the patients History and Physical, chart, labs and discussed the procedure including the risks, benefits and alternatives for the proposed anesthesia with the patient or authorized representative who has indicated his/her understanding and acceptance.     Dental Advisory Given  Plan Discussed with: CRNA and Surgeon  Anesthesia Plan Comments:          Anesthesia Quick Evaluation  "

## 2025-01-18 ENCOUNTER — Encounter: Admitting: Podiatry

## 2025-01-25 ENCOUNTER — Encounter: Admitting: Podiatry

## 2025-02-01 ENCOUNTER — Encounter: Admitting: Podiatry
